# Patient Record
Sex: Female | Born: 1950 | Race: Black or African American | Hispanic: No | Marital: Single | Smoking: Former smoker
Health system: Southern US, Community
[De-identification: ages and names within clinical notes are randomized; demographics above are authoritative.]

## PROBLEM LIST (undated history)

## (undated) DIAGNOSIS — I10 Essential (primary) hypertension: Secondary | ICD-10-CM

## (undated) DIAGNOSIS — Z923 Personal history of irradiation: Secondary | ICD-10-CM

## (undated) DIAGNOSIS — B379 Candidiasis, unspecified: Secondary | ICD-10-CM

## (undated) DIAGNOSIS — C50211 Malignant neoplasm of upper-inner quadrant of right female breast: Secondary | ICD-10-CM

## (undated) DIAGNOSIS — K219 Gastro-esophageal reflux disease without esophagitis: Secondary | ICD-10-CM

## (undated) DIAGNOSIS — Z87410 Personal history of cervical dysplasia: Secondary | ICD-10-CM

## (undated) DIAGNOSIS — K449 Diaphragmatic hernia without obstruction or gangrene: Secondary | ICD-10-CM

## (undated) DIAGNOSIS — I639 Cerebral infarction, unspecified: Secondary | ICD-10-CM

## (undated) DIAGNOSIS — H547 Unspecified visual loss: Secondary | ICD-10-CM

## (undated) HISTORY — PX: TONSILLECTOMY: SUR1361

## (undated) HISTORY — PX: WISDOM TOOTH EXTRACTION: SHX21

## (undated) HISTORY — DX: Candidiasis, unspecified: B37.9

---

## 1998-05-06 DIAGNOSIS — I639 Cerebral infarction, unspecified: Secondary | ICD-10-CM

## 1998-05-06 HISTORY — DX: Cerebral infarction, unspecified: I63.9

## 1998-11-06 ENCOUNTER — Encounter: Payer: Self-pay | Admitting: Emergency Medicine

## 1998-11-07 ENCOUNTER — Inpatient Hospital Stay (HOSPITAL_COMMUNITY): Admission: EM | Admit: 1998-11-07 | Discharge: 1998-11-11 | Payer: Self-pay | Admitting: Emergency Medicine

## 1998-11-07 ENCOUNTER — Encounter: Payer: Self-pay | Admitting: Internal Medicine

## 1998-11-08 ENCOUNTER — Encounter: Payer: Self-pay | Admitting: Internal Medicine

## 1998-11-09 ENCOUNTER — Encounter: Payer: Self-pay | Admitting: Internal Medicine

## 1998-11-20 ENCOUNTER — Inpatient Hospital Stay (HOSPITAL_COMMUNITY): Admission: RE | Admit: 1998-11-20 | Discharge: 1998-11-21 | Payer: Self-pay | Admitting: *Deleted

## 1998-11-29 ENCOUNTER — Encounter: Admission: RE | Admit: 1998-11-29 | Discharge: 1998-11-29 | Payer: Self-pay | Admitting: Internal Medicine

## 1999-05-07 HISTORY — PX: CAROTID ENDARTERECTOMY: SUR193

## 1999-09-04 ENCOUNTER — Encounter: Payer: Self-pay | Admitting: Surgery

## 1999-09-04 ENCOUNTER — Encounter: Admission: RE | Admit: 1999-09-04 | Discharge: 1999-09-04 | Payer: Self-pay | Admitting: Surgery

## 2000-02-19 ENCOUNTER — Encounter: Payer: Self-pay | Admitting: Surgery

## 2000-02-19 ENCOUNTER — Encounter: Admission: RE | Admit: 2000-02-19 | Discharge: 2000-02-19 | Payer: Self-pay | Admitting: Surgery

## 2000-03-05 ENCOUNTER — Encounter: Admission: RE | Admit: 2000-03-05 | Discharge: 2000-03-05 | Payer: Self-pay | Admitting: *Deleted

## 2000-03-05 ENCOUNTER — Encounter: Payer: Self-pay | Admitting: *Deleted

## 2000-03-14 ENCOUNTER — Encounter: Payer: Self-pay | Admitting: *Deleted

## 2000-03-14 ENCOUNTER — Encounter: Admission: RE | Admit: 2000-03-14 | Discharge: 2000-03-14 | Payer: Self-pay | Admitting: *Deleted

## 2001-04-30 ENCOUNTER — Encounter: Admission: RE | Admit: 2001-04-30 | Discharge: 2001-04-30 | Payer: Self-pay | Admitting: *Deleted

## 2001-04-30 ENCOUNTER — Encounter: Payer: Self-pay | Admitting: *Deleted

## 2001-05-04 ENCOUNTER — Other Ambulatory Visit: Admission: RE | Admit: 2001-05-04 | Discharge: 2001-05-04 | Payer: Self-pay | Admitting: Obstetrics and Gynecology

## 2001-09-30 ENCOUNTER — Encounter: Payer: Self-pay | Admitting: Obstetrics and Gynecology

## 2001-09-30 ENCOUNTER — Encounter: Admission: RE | Admit: 2001-09-30 | Discharge: 2001-09-30 | Payer: Self-pay | Admitting: Obstetrics and Gynecology

## 2002-11-23 ENCOUNTER — Encounter: Admission: RE | Admit: 2002-11-23 | Discharge: 2002-11-23 | Payer: Self-pay | Admitting: Obstetrics and Gynecology

## 2002-11-23 ENCOUNTER — Encounter: Payer: Self-pay | Admitting: Obstetrics and Gynecology

## 2003-04-05 ENCOUNTER — Encounter: Admission: RE | Admit: 2003-04-05 | Discharge: 2003-04-05 | Payer: Self-pay | Admitting: Surgery

## 2003-05-25 ENCOUNTER — Other Ambulatory Visit: Admission: RE | Admit: 2003-05-25 | Discharge: 2003-05-25 | Payer: Self-pay | Admitting: Family Medicine

## 2003-08-04 ENCOUNTER — Ambulatory Visit (HOSPITAL_BASED_OUTPATIENT_CLINIC_OR_DEPARTMENT_OTHER): Admission: RE | Admit: 2003-08-04 | Discharge: 2003-08-04 | Payer: Self-pay | Admitting: Family Medicine

## 2003-08-16 ENCOUNTER — Encounter (HOSPITAL_COMMUNITY): Admission: RE | Admit: 2003-08-16 | Discharge: 2003-11-14 | Payer: Self-pay | Admitting: Family Medicine

## 2005-02-25 ENCOUNTER — Encounter: Admission: RE | Admit: 2005-02-25 | Discharge: 2005-02-25 | Payer: Self-pay | Admitting: Family Medicine

## 2005-03-29 ENCOUNTER — Emergency Department (HOSPITAL_COMMUNITY): Admission: EM | Admit: 2005-03-29 | Discharge: 2005-03-29 | Payer: Self-pay | Admitting: Family Medicine

## 2005-09-10 ENCOUNTER — Emergency Department (HOSPITAL_COMMUNITY): Admission: EM | Admit: 2005-09-10 | Discharge: 2005-09-10 | Payer: Self-pay | Admitting: Family Medicine

## 2006-02-26 ENCOUNTER — Encounter: Admission: RE | Admit: 2006-02-26 | Discharge: 2006-02-26 | Payer: Self-pay | Admitting: Family Medicine

## 2006-05-02 ENCOUNTER — Other Ambulatory Visit: Admission: RE | Admit: 2006-05-02 | Discharge: 2006-05-02 | Payer: Self-pay | Admitting: Family Medicine

## 2006-10-14 ENCOUNTER — Encounter: Admission: RE | Admit: 2006-10-14 | Discharge: 2006-10-14 | Payer: Self-pay | Admitting: Family Medicine

## 2006-10-31 ENCOUNTER — Ambulatory Visit (HOSPITAL_COMMUNITY): Admission: RE | Admit: 2006-10-31 | Discharge: 2006-10-31 | Payer: Self-pay | Admitting: Gastroenterology

## 2006-11-12 ENCOUNTER — Encounter: Admission: RE | Admit: 2006-11-12 | Discharge: 2006-11-12 | Payer: Self-pay | Admitting: Gastroenterology

## 2007-03-12 ENCOUNTER — Encounter: Admission: RE | Admit: 2007-03-12 | Discharge: 2007-03-12 | Payer: Self-pay | Admitting: Family Medicine

## 2007-05-05 ENCOUNTER — Other Ambulatory Visit: Admission: RE | Admit: 2007-05-05 | Discharge: 2007-05-05 | Payer: Self-pay | Admitting: Family Medicine

## 2007-07-20 ENCOUNTER — Encounter: Admission: RE | Admit: 2007-07-20 | Discharge: 2007-07-20 | Payer: Self-pay | Admitting: Family Medicine

## 2008-03-14 ENCOUNTER — Encounter: Admission: RE | Admit: 2008-03-14 | Discharge: 2008-03-14 | Payer: Self-pay | Admitting: Family Medicine

## 2008-05-05 ENCOUNTER — Other Ambulatory Visit: Admission: RE | Admit: 2008-05-05 | Discharge: 2008-05-05 | Payer: Self-pay | Admitting: Family Medicine

## 2009-03-15 ENCOUNTER — Encounter: Admission: RE | Admit: 2009-03-15 | Discharge: 2009-03-15 | Payer: Self-pay | Admitting: Family Medicine

## 2009-05-03 ENCOUNTER — Other Ambulatory Visit: Admission: RE | Admit: 2009-05-03 | Discharge: 2009-05-03 | Payer: Self-pay | Admitting: Family Medicine

## 2010-03-15 ENCOUNTER — Encounter: Admission: RE | Admit: 2010-03-15 | Discharge: 2010-03-15 | Payer: Self-pay | Admitting: Obstetrics and Gynecology

## 2010-05-26 ENCOUNTER — Encounter: Payer: Self-pay | Admitting: Family Medicine

## 2010-09-18 NOTE — Op Note (Signed)
Caitlyn Mccoy, Caitlyn Mccoy               ACCOUNT NO.:  [id number]   MEDICAL RECORD NO.:  [id number]          PATIENT TYPE:  AMB   LOCATION:  ENDO                         FACILITY:  [facility]   PHYSICIAN:  Anselmo Rod, M.D.  DATE OF BIRTH:  [birth year]   DATE OF PROCEDURE:  10/31/2006  DATE OF DISCHARGE:                               OPERATIVE REPORT   PROCEDURE PERFORMED:  Screening colonoscopy.   ENDOSCOPIST:  Anselmo Rod, MD   INSTRUMENT USED:  Pentax video colonoscope.   INDICATIONS FOR PROCEDURE:  A 60 year old African American female  undergoing a screening colonoscopy to rule out colonic polyps, masses,  etc.   PREPROCEDURE PREPARATION:  Informed consent was procured from the  patient. The patient was fasted for 8 hours prior to the procedure and  prepped with a bottle of magnesium citrate, 2 Dulcolax pills and a  gallon of NuLytely the night prior to procedure.  Risks and benefits of  the procedure including a 10% miss rate of cancer in polyps were  discussed with the patient as well.   PREPROCEDURE PHYSICAL:  VITAL SIGNS:  The patient had stable vital  signs.  NECK:  Supple.  CHEST:  Clear to auscultation.  CARDIAC:  S1 and S2 regular.  ABDOMEN:  Soft with normal bowel sounds.   DESCRIPTION OF PROCEDURE:  The patient was placed in the left lateral  decubitus position and sedated with 75 mcg of Fentanyl and 7.5 mg of  Versed given intravenously in slow incremental doses. Once the patient  was adequately sedated and maintained on low-flow oxygen and continuous  cardiac monitoring, the Pentax video colonoscope was advanced from the  rectum to the cecum. The patient had an excellent prep. No masses,  polyps, erosions, ulcerations or diverticula were seen.  The appendiceal  orifice and ileocecal valve were clearly visualized and photographed.  The terminal ileum was briefly visualized and appeared normal.  Retroflexion in the rectum revealed no abnormalities. The patient  tolerated the procedure well without immediate complications.   IMPRESSION:  1. Normal colonoscopy up to the terminal ileum, no masses, polyps,      erosions, ulcerations or diverticula seen.  2. No abnormalities noted on retroflexion.   RECOMMENDATIONS:  1. Continue a high-fiber diet with probiotics line Align.  2. Outpatient followup in the next 2 weeks for further evaluation of      her dysphagia and upper GI symptoms.  3. A repeat colonoscopy in the next 10 years.  If the patient has any      abnormal symptoms in the interim, she should contact the office      immediately for further recommendations with regards to CRC      screening at an earlier date.      Anselmo Rod, M.D.  Electronically Signed     JNM/MEDQ  D:  10/31/2006  T:  10/31/2006  Job:  [id number]

## 2011-02-01 ENCOUNTER — Other Ambulatory Visit: Payer: Self-pay | Admitting: Obstetrics and Gynecology

## 2011-02-01 DIAGNOSIS — Z1231 Encounter for screening mammogram for malignant neoplasm of breast: Secondary | ICD-10-CM

## 2011-03-18 ENCOUNTER — Ambulatory Visit
Admission: RE | Admit: 2011-03-18 | Discharge: 2011-03-18 | Disposition: A | Discharge status: Home / Self Care | Payer: BC Managed Care – PPO | Source: Ambulatory Visit | Attending: Obstetrics and Gynecology | Admitting: Obstetrics and Gynecology

## 2011-03-18 ENCOUNTER — Ambulatory Visit: Payer: Self-pay

## 2011-03-18 DIAGNOSIS — Z1231 Encounter for screening mammogram for malignant neoplasm of breast: Secondary | ICD-10-CM

## 2012-02-07 ENCOUNTER — Other Ambulatory Visit: Payer: Self-pay | Admitting: Obstetrics and Gynecology

## 2012-02-07 DIAGNOSIS — Z1231 Encounter for screening mammogram for malignant neoplasm of breast: Secondary | ICD-10-CM

## 2012-02-26 ENCOUNTER — Encounter: Payer: BC Managed Care – PPO | Admitting: Family Medicine

## 2012-03-17 ENCOUNTER — Encounter: Payer: Self-pay | Admitting: Family Medicine

## 2012-03-17 ENCOUNTER — Other Ambulatory Visit: Payer: Self-pay

## 2012-03-17 ENCOUNTER — Ambulatory Visit (INDEPENDENT_AMBULATORY_CARE_PROVIDER_SITE_OTHER): Payer: BC Managed Care – PPO | Admitting: Family Medicine

## 2012-03-17 VITALS — BP 126/70 | HR 71 | Ht 58.5 in | Wt 174.0 lb

## 2012-03-17 DIAGNOSIS — K219 Gastro-esophageal reflux disease without esophagitis: Secondary | ICD-10-CM

## 2012-03-17 DIAGNOSIS — Z8673 Personal history of transient ischemic attack (TIA), and cerebral infarction without residual deficits: Secondary | ICD-10-CM

## 2012-03-17 DIAGNOSIS — Z9889 Other specified postprocedural states: Secondary | ICD-10-CM

## 2012-03-17 DIAGNOSIS — Z8639 Personal history of other endocrine, nutritional and metabolic disease: Secondary | ICD-10-CM

## 2012-03-17 DIAGNOSIS — Z0000 Encounter for general adult medical examination without abnormal findings: Secondary | ICD-10-CM

## 2012-03-17 DIAGNOSIS — E669 Obesity, unspecified: Secondary | ICD-10-CM

## 2012-03-17 DIAGNOSIS — Z23 Encounter for immunization: Secondary | ICD-10-CM

## 2012-03-17 DIAGNOSIS — R6889 Other general symptoms and signs: Secondary | ICD-10-CM

## 2012-03-17 LAB — CBC WITH DIFFERENTIAL/PLATELET
Basophils Relative: 0 % (ref 0–1)
Eosinophils Absolute: 0.1 10*3/uL (ref 0.0–0.7)
HCT: 42.3 % (ref 36.0–46.0)
Hemoglobin: 14.5 g/dL (ref 12.0–15.0)
MCH: 29.5 pg (ref 26.0–34.0)
MCHC: 34.3 g/dL (ref 30.0–36.0)
MCV: 86.2 fL (ref 78.0–100.0)
Monocytes Absolute: 0.4 10*3/uL (ref 0.1–1.0)
Monocytes Relative: 6 % (ref 3–12)
Neutro Abs: 3.8 10*3/uL (ref 1.7–7.7)

## 2012-03-17 MED ORDER — DEXLANSOPRAZOLE 60 MG PO CPDR: 60.0000 mg | DELAYED_RELEASE_CAPSULE | Freq: Every day | ORAL | Status: DC

## 2012-03-17 NOTE — Progress Notes (Signed)
Subjective:    Patient ID: Caitlyn Mccoy, female    DOB: [birth year], 61 y.o.   MRN: [id number]  HPI She is here for complete examination. She is on a weight loss program eating mainly fruits and vegetables avoiding meats and dairy and has lost 14 pounds by her recollection. Does complain of bad breath and has described this to the dentist who apparently found nothing. She complains of cold intolerant. She apparently also has had a history of goiter and was hypothyroid presently not on meds she has had reflux symptoms in the past and has tried Protonix, Nexium and Prilosec none of which have worked. He complains of bad breath and thinks it could be related to this She also states she had a CVA and still has some difficulty with swallowing this for. She has had a carotid endarterectomy Review of Systems Negative except as above    Objective:   Physical Exam BP 126/70  Pulse 71  Ht 4' 10.5" (1.486 m)  Wt 174 lb (78.926 kg)  BMI 35.75 kg/m2  SpO2 96%  General Appearance:    Alert, cooperative, no distress, appears stated age  Head:    Normocephalic, without obvious abnormality, atraumatic  Eyes:    PERRL, conjunctiva/corneas clear, EOM's intact, fundi    benign  Ears:    Normal TM's and external ear canals  Nose:   Nares normal, mucosa normal, no drainage or sinus   tenderness  Throat:   Lips, mucosa, and tongue normal; teeth and gums normal  Neck:   Supple, no lymphadenopathy;  thyroid:  no   enlargement/tenderness/nodules; no carotid   bruit or JVD  Back:    Spine nontender, no curvature, ROM normal, no CVA     tenderness  Lungs:     Clear to auscultation bilaterally without wheezes, rales or     ronchi; respirations unlabored  Chest Wall:    No tenderness or deformity   Heart:    Regular rate and rhythm, S1 and S2 normal, no murmur, rub   or gallop  Breast Exam:    Deferred to GYN  Abdomen:     Soft, non-tender, nondistended, normoactive bowel sounds,    no masses, no  hepatosplenomegaly  Genitalia:    Deferred to GYN     Extremities:   No clubbing, cyanosis or edema  Pulses:   2+ and symmetric all extremities  Skin:   Skin color, texture, turgor normal, no rashes or lesions  Lymph nodes:   Cervical, supraclavicular, and axillary nodes normal  Neurologic:   CNII-XII intact, normal strength, sensation and gait; reflexes 2+ and symmetric throughout          Psych:   Normal mood, affect, hygiene and grooming.          Assessment & Plan:   1. Cold intolerance  T3, Free, T4, Free  2. Routine general medical examination at a health care facility  Lipid panel, CBC with Differential, Comprehensive metabolic panel, Vitamin D 25 hydroxy, TSH, Tdap vaccine greater than or equal to 7yo IM  3. GERD (gastroesophageal reflux disease)  DISCONTINUED: dexlansoprazole (DEXILANT) 60 MG capsule  4. Personal history of goiter  TSH  5. Obesity (BMI 30-39.9)  Lipid panel  6. History of CVA (cerebrovascular accident)    7. History of carotid endarterectomy     she has had a colonoscopy. She refused both the flu shot and Zostavax Sample of Dexilant given to see if this will help with her reflux symptoms and  possibly her bad breath.

## 2012-03-17 NOTE — Telephone Encounter (Signed)
PT ASK IF SHE COULD HAVE 5 MORE GAVE  HER 5 MORE

## 2012-03-18 ENCOUNTER — Ambulatory Visit
Admission: RE | Admit: 2012-03-18 | Discharge: 2012-03-18 | Disposition: A | Discharge status: Home / Self Care | Payer: BC Managed Care – PPO | Source: Ambulatory Visit | Attending: Obstetrics and Gynecology | Admitting: Obstetrics and Gynecology

## 2012-03-18 ENCOUNTER — Other Ambulatory Visit: Payer: Self-pay | Admitting: Family Medicine

## 2012-03-18 DIAGNOSIS — Z1231 Encounter for screening mammogram for malignant neoplasm of breast: Secondary | ICD-10-CM

## 2012-03-18 LAB — T3, FREE: T3, Free: 3 pg/mL (ref 2.3–4.2)

## 2012-03-18 LAB — COMPREHENSIVE METABOLIC PANEL
Albumin: 4.1 g/dL (ref 3.5–5.2)
Alkaline Phosphatase: 116 U/L (ref 39–117)
BUN: 16 mg/dL (ref 6–23)
Glucose, Bld: 69 mg/dL — ABNORMAL LOW (ref 70–99)
Potassium: 4 mEq/L (ref 3.5–5.3)

## 2012-03-18 LAB — LIPID PANEL
Cholesterol: 214 mg/dL — ABNORMAL HIGH (ref 0–200)
Triglycerides: 67 mg/dL (ref <150)

## 2012-03-18 LAB — TSH: TSH: 0.461 u[IU]/mL (ref 0.350–4.500)

## 2012-03-19 ENCOUNTER — Telehealth: Payer: Self-pay | Admitting: Family Medicine

## 2012-03-19 NOTE — Telephone Encounter (Signed)
Left pt message on phone [phone] mailed out lab results yesterday and med should not interfear with hernia

## 2012-03-19 NOTE — Telephone Encounter (Signed)
PT CALLED AND STATED THAT SHE HAS A HIATAL HERNIA THAT SHE FAILED TO MENTIONED ON THE 12TH.  SHE JUST WANTS TO MAKE SURE THAT WILL NOT CAUSE ISSUES FOR THE NEW MEDICATION DEXILANT THAT YOU HAVE PLACED HER ON . PLEASE CALL PT.

## 2012-03-19 NOTE — Telephone Encounter (Signed)
Let her know that the hiatus hernia issue should not interfere with Dexilant and will most likely help if she is having any hernia symptoms

## 2012-03-26 ENCOUNTER — Other Ambulatory Visit: Payer: Self-pay

## 2012-03-26 DIAGNOSIS — K219 Gastro-esophageal reflux disease without esophagitis: Secondary | ICD-10-CM

## 2012-03-26 MED ORDER — DEXLANSOPRAZOLE 60 MG PO CPDR: 60.0000 mg | DELAYED_RELEASE_CAPSULE | Freq: Every day | ORAL | Status: DC

## 2012-03-31 ENCOUNTER — Other Ambulatory Visit: Payer: Self-pay | Admitting: Obstetrics and Gynecology

## 2012-04-01 ENCOUNTER — Telehealth: Payer: Self-pay | Admitting: Family Medicine

## 2012-04-06 NOTE — Telephone Encounter (Signed)
LM

## 2012-04-27 ENCOUNTER — Encounter: Payer: BC Managed Care – PPO | Admitting: Family Medicine

## 2012-05-12 ENCOUNTER — Encounter: Payer: Self-pay | Admitting: Obstetrics and Gynecology

## 2012-05-12 ENCOUNTER — Ambulatory Visit (INDEPENDENT_AMBULATORY_CARE_PROVIDER_SITE_OTHER): Payer: BC Managed Care – PPO | Admitting: Obstetrics and Gynecology

## 2012-05-12 VITALS — BP 136/86 | Ht 59.0 in | Wt 175.0 lb

## 2012-05-12 DIAGNOSIS — Z124 Encounter for screening for malignant neoplasm of cervix: Secondary | ICD-10-CM

## 2012-05-12 MED ORDER — VITAMIN D (ERGOCALCIFEROL) 1.25 MG (50000 UNIT) PO CAPS: 50000.0000 [IU] | ORAL_CAPSULE | ORAL | Status: DC

## 2012-05-12 NOTE — Patient Instructions (Signed)
Exercise to Stay Healthy Exercise helps you become and stay healthy. EXERCISE IDEAS AND TIPS Choose exercises that:  You enjoy.  Fit into your day. You do not need to exercise really hard to be healthy. You can do exercises at a slow or medium level and stay healthy. You can:  Stretch before and after working out.  Try yoga, Pilates, or tai chi.  Lift weights.  Walk fast, swim, jog, run, climb stairs, bicycle, dance, or rollerskate.  Take aerobic classes. Exercises that burn about 150 calories:  Running 1  miles in 15 minutes.  Playing volleyball for 45 to 60 minutes.  Washing and waxing a car for 45 to 60 minutes.  Playing touch football for 45 minutes.  Walking 1  miles in 35 minutes.  Pushing a stroller 1  miles in 30 minutes.  Playing basketball for 30 minutes.  Raking leaves for 30 minutes.  Bicycling 5 miles in 30 minutes.  Walking 2 miles in 30 minutes.  Dancing for 30 minutes.  Shoveling snow for 15 minutes.  Swimming laps for 20 minutes.  Walking up stairs for 15 minutes.  Bicycling 4 miles in 15 minutes.  Gardening for 30 to 45 minutes.  Jumping rope for 15 minutes.  Washing windows or floors for 45 to 60 minutes. Document Released: 05/25/2010 Document Revised: 07/15/2011 Document Reviewed: 05/25/2010 [employer] Patient Information 2013 [employer].  

## 2012-05-12 NOTE — Progress Notes (Signed)
Last Pap: 05/09/2011 WNL: Yes Regular Periods:no postmenopausal Contraception: none  Monthly Breast exam:yes Tetanus<25yrs:yes Nl.Bladder Function:yes Daily BMs:yes Healthy Diet:yes Calcium:no Mammogram:yes Date of Mammogram: 03/18/2012 Exercise:yes Have often Exercise: elliptical twice weekly  Seatbelt: yes Abuse at home: no Stressful work:yes sometimes  Sigmoid-colonoscopy: per pt 2007 Bone Density: yes 03/15/2009 PCP: Dr Sharlot Gowda  Change in PMH: none Change in ZOX:WRUE  BP 136/86  Ht 4\' 11"  (1.499 m)  Wt 175 lb (79.379 kg)  BMI 35.35 kg/m2 Pt without c/o Physical Examination: Neck - supple, no significant adenopathy Chest - clear to auscultation, no wheezes, rales or rhonchi, symmetric air entry Heart - normal rate, regular rhythm, normal S1, S2, no murmurs, rubs, clicks or gallops Abdomen - soft, nontender, nondistended, no masses or organomegaly Breasts - breasts appear normal, no suspicious masses, no skin or nipple changes or axillary nodes Pelvic - normal external genitalia, vulva, vagina, cervix, uterus and adnexa, atrophic Rectal - normal rectal, no masses Musculoskeletal - no joint tenderness, deformity or swelling Extremities - peripheral pulses normal, no pedal edema, no clubbing or cyanosis Skin - normal coloration and turgor, no rashes, no suspicious skin lesions noted Low vitamin D.  21 will replace for 12 weeks and then recheck the level Liver function and cholestrol levels improved Normal AEX Pt due for mammogram no colonoscopy due no Pap done yes if normal repeat in 3 yrs RT one year Diet and exercise discussed

## 2012-05-14 LAB — PAP IG W/ RFLX HPV ASCU

## 2012-07-07 ENCOUNTER — Telehealth: Payer: Self-pay | Admitting: Family Medicine

## 2012-07-07 NOTE — Telephone Encounter (Signed)
Pt advised we do not have any dexilant samples but we have a savings card pt states she will pick up in the morning

## 2012-08-11 ENCOUNTER — Other Ambulatory Visit: Payer: BC Managed Care – PPO

## 2013-02-05 ENCOUNTER — Other Ambulatory Visit: Payer: Self-pay

## 2013-02-05 DIAGNOSIS — Z1231 Encounter for screening mammogram for malignant neoplasm of breast: Secondary | ICD-10-CM

## 2013-03-19 ENCOUNTER — Ambulatory Visit
Admission: RE | Admit: 2013-03-19 | Discharge: 2013-03-19 | Disposition: A | Discharge status: Home / Self Care | Payer: BC Managed Care – PPO | Source: Ambulatory Visit

## 2013-03-19 DIAGNOSIS — Z1231 Encounter for screening mammogram for malignant neoplasm of breast: Secondary | ICD-10-CM

## 2014-02-08 ENCOUNTER — Other Ambulatory Visit: Payer: Self-pay

## 2014-02-08 DIAGNOSIS — Z1231 Encounter for screening mammogram for malignant neoplasm of breast: Secondary | ICD-10-CM

## 2014-03-07 ENCOUNTER — Encounter: Payer: Self-pay | Admitting: Obstetrics and Gynecology

## 2014-03-21 ENCOUNTER — Ambulatory Visit: Payer: BC Managed Care – PPO

## 2014-03-22 ENCOUNTER — Ambulatory Visit
Admission: RE | Admit: 2014-03-22 | Discharge: 2014-03-22 | Disposition: A | Discharge status: Home / Self Care | Payer: BC Managed Care – PPO | Source: Ambulatory Visit

## 2014-03-22 ENCOUNTER — Encounter (INDEPENDENT_AMBULATORY_CARE_PROVIDER_SITE_OTHER): Payer: Self-pay

## 2014-03-22 DIAGNOSIS — Z1231 Encounter for screening mammogram for malignant neoplasm of breast: Secondary | ICD-10-CM

## 2014-05-02 ENCOUNTER — Encounter: Payer: Self-pay | Admitting: Family Medicine

## 2015-02-10 ENCOUNTER — Other Ambulatory Visit: Payer: Self-pay | Admitting: Obstetrics and Gynecology

## 2015-02-10 DIAGNOSIS — Z1231 Encounter for screening mammogram for malignant neoplasm of breast: Secondary | ICD-10-CM

## 2015-03-24 ENCOUNTER — Ambulatory Visit
Admission: RE | Admit: 2015-03-24 | Discharge: 2015-03-24 | Disposition: A | Discharge status: Home / Self Care | Payer: BC Managed Care – PPO | Source: Ambulatory Visit | Attending: Obstetrics and Gynecology | Admitting: Obstetrics and Gynecology

## 2015-03-24 DIAGNOSIS — Z1231 Encounter for screening mammogram for malignant neoplasm of breast: Secondary | ICD-10-CM

## 2016-01-29 ENCOUNTER — Other Ambulatory Visit: Payer: Self-pay | Admitting: Obstetrics and Gynecology

## 2016-01-29 DIAGNOSIS — Z1231 Encounter for screening mammogram for malignant neoplasm of breast: Secondary | ICD-10-CM

## 2016-03-25 ENCOUNTER — Ambulatory Visit
Admission: RE | Admit: 2016-03-25 | Discharge: 2016-03-25 | Disposition: A | Discharge status: Home / Self Care | Payer: BC Managed Care – PPO | Source: Ambulatory Visit | Attending: Obstetrics and Gynecology | Admitting: Obstetrics and Gynecology

## 2016-03-25 DIAGNOSIS — Z1231 Encounter for screening mammogram for malignant neoplasm of breast: Secondary | ICD-10-CM

## 2017-01-02 ENCOUNTER — Other Ambulatory Visit: Payer: Self-pay | Admitting: Obstetrics and Gynecology

## 2017-01-02 DIAGNOSIS — Z1231 Encounter for screening mammogram for malignant neoplasm of breast: Secondary | ICD-10-CM

## 2017-03-26 ENCOUNTER — Ambulatory Visit
Admission: RE | Admit: 2017-03-26 | Discharge: 2017-03-26 | Disposition: A | Discharge status: Home / Self Care | Payer: BC Managed Care – PPO | Source: Ambulatory Visit | Attending: Obstetrics and Gynecology | Admitting: Obstetrics and Gynecology

## 2017-03-26 DIAGNOSIS — Z1231 Encounter for screening mammogram for malignant neoplasm of breast: Secondary | ICD-10-CM

## 2018-02-06 ENCOUNTER — Other Ambulatory Visit: Payer: Self-pay | Admitting: Obstetrics and Gynecology

## 2018-02-06 DIAGNOSIS — Z1231 Encounter for screening mammogram for malignant neoplasm of breast: Secondary | ICD-10-CM

## 2018-03-30 ENCOUNTER — Ambulatory Visit: Payer: Self-pay | Admitting: Nurse Practitioner

## 2018-03-30 ENCOUNTER — Ambulatory Visit
Admission: RE | Admit: 2018-03-30 | Discharge: 2018-03-30 | Disposition: A | Discharge status: Home / Self Care | Payer: BC Managed Care – PPO | Source: Ambulatory Visit | Attending: Obstetrics and Gynecology | Admitting: Obstetrics and Gynecology

## 2018-03-30 DIAGNOSIS — Z1231 Encounter for screening mammogram for malignant neoplasm of breast: Secondary | ICD-10-CM

## 2018-04-01 ENCOUNTER — Other Ambulatory Visit: Payer: Self-pay | Admitting: Otolaryngology

## 2018-04-01 ENCOUNTER — Other Ambulatory Visit: Payer: Self-pay | Admitting: Obstetrics and Gynecology

## 2018-04-01 DIAGNOSIS — R1314 Dysphagia, pharyngoesophageal phase: Secondary | ICD-10-CM

## 2018-04-01 DIAGNOSIS — R928 Other abnormal and inconclusive findings on diagnostic imaging of breast: Secondary | ICD-10-CM

## 2018-04-27 ENCOUNTER — Ambulatory Visit
Admission: RE | Admit: 2018-04-27 | Discharge: 2018-04-27 | Disposition: A | Discharge status: Home / Self Care | Payer: BC Managed Care – PPO | Source: Ambulatory Visit | Attending: Obstetrics and Gynecology | Admitting: Obstetrics and Gynecology

## 2018-04-27 ENCOUNTER — Other Ambulatory Visit: Payer: BC Managed Care – PPO

## 2018-04-27 ENCOUNTER — Ambulatory Visit
Admission: RE | Admit: 2018-04-27 | Discharge: 2018-04-27 | Disposition: A | Discharge status: Home / Self Care | Payer: BC Managed Care – PPO | Source: Ambulatory Visit | Attending: Otolaryngology | Admitting: Otolaryngology

## 2018-04-27 ENCOUNTER — Other Ambulatory Visit: Payer: Self-pay | Admitting: Obstetrics and Gynecology

## 2018-04-27 DIAGNOSIS — R928 Other abnormal and inconclusive findings on diagnostic imaging of breast: Secondary | ICD-10-CM

## 2018-04-27 DIAGNOSIS — R1314 Dysphagia, pharyngoesophageal phase: Secondary | ICD-10-CM

## 2018-04-27 DIAGNOSIS — N6310 Unspecified lump in the right breast, unspecified quadrant: Secondary | ICD-10-CM

## 2018-05-26 ENCOUNTER — Other Ambulatory Visit: Payer: Self-pay | Admitting: Obstetrics and Gynecology

## 2018-05-26 ENCOUNTER — Ambulatory Visit
Admission: RE | Admit: 2018-05-26 | Discharge: 2018-05-26 | Disposition: A | Discharge status: Home / Self Care | Payer: BC Managed Care – PPO | Source: Ambulatory Visit | Attending: Obstetrics and Gynecology | Admitting: Obstetrics and Gynecology

## 2018-05-26 DIAGNOSIS — N6310 Unspecified lump in the right breast, unspecified quadrant: Secondary | ICD-10-CM

## 2018-05-27 ENCOUNTER — Other Ambulatory Visit: Payer: Self-pay | Admitting: Obstetrics and Gynecology

## 2018-05-27 DIAGNOSIS — Z853 Personal history of malignant neoplasm of breast: Secondary | ICD-10-CM

## 2018-05-28 ENCOUNTER — Other Ambulatory Visit: Payer: Self-pay | Admitting: Obstetrics and Gynecology

## 2018-05-28 DIAGNOSIS — C50911 Malignant neoplasm of unspecified site of right female breast: Secondary | ICD-10-CM

## 2018-05-29 ENCOUNTER — Other Ambulatory Visit: Payer: Self-pay | Admitting: Family Medicine

## 2018-06-01 ENCOUNTER — Ambulatory Visit
Admission: RE | Admit: 2018-06-01 | Discharge: 2018-06-01 | Disposition: A | Discharge status: Home / Self Care | Payer: BC Managed Care – PPO | Source: Ambulatory Visit | Attending: Obstetrics and Gynecology | Admitting: Obstetrics and Gynecology

## 2018-06-01 ENCOUNTER — Other Ambulatory Visit: Payer: Self-pay | Admitting: General Surgery

## 2018-06-01 DIAGNOSIS — C50911 Malignant neoplasm of unspecified site of right female breast: Secondary | ICD-10-CM

## 2018-06-01 DIAGNOSIS — C50211 Malignant neoplasm of upper-inner quadrant of right female breast: Secondary | ICD-10-CM

## 2018-06-01 DIAGNOSIS — Z170 Estrogen receptor positive status [ER+]: Principal | ICD-10-CM

## 2018-06-02 ENCOUNTER — Other Ambulatory Visit: Payer: Self-pay | Admitting: General Surgery

## 2018-06-02 DIAGNOSIS — C50211 Malignant neoplasm of upper-inner quadrant of right female breast: Secondary | ICD-10-CM

## 2018-06-02 DIAGNOSIS — Z170 Estrogen receptor positive status [ER+]: Principal | ICD-10-CM

## 2018-06-04 ENCOUNTER — Encounter (HOSPITAL_BASED_OUTPATIENT_CLINIC_OR_DEPARTMENT_OTHER): Payer: Self-pay | Admitting: *Deleted

## 2018-06-04 ENCOUNTER — Other Ambulatory Visit: Payer: Self-pay

## 2018-06-06 DIAGNOSIS — C50919 Malignant neoplasm of unspecified site of unspecified female breast: Secondary | ICD-10-CM

## 2018-06-06 HISTORY — DX: Malignant neoplasm of unspecified site of unspecified female breast: C50.919

## 2018-06-06 HISTORY — PX: BREAST LUMPECTOMY: SHX2

## 2018-06-07 NOTE — H&P (Signed)
Caitlyn Mccoy Location: [facility] Patient #: [id number] DOB: 1950/07/08 Single / Language: Caitlyn Mccoy / Race: Black or African American Female        History of Present Illness       This is a very pleasant 68 year old female. She is referred by [name], her gynecologist and by [name] at the [facility] for evaluation and management of a invasive cancer in the right breast upper inner quadrant. Her daughter is present and Caitlyn Mccoy was my chaperone throughout the encounter. Caitlyn Mccoy is her PCP who she sees occasionally.     She has no prior history of breast problems. He gets annual mammograms. Mammograms recently showed a 7 mm mass in the right breast upper inner quadrant. No other mass in either breast. Ultrasound of the right axilla was negative. 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 ER and PR 100% strongly positive. Ki-67 12%. She has a hematoma     This history is significant for cerebrovascular accident in 2000. That was followed by a right carotid endarterectomy. She has no residual. She is not on any blood thinners now. BMI 35. Family history reveals mother had diabetes. Father had a heart attack. No family history of breast, ovarian, or prostate cancer. Social history reveals she is single. Quit smoking 30 years ago. Denies alcohol. She has 1 daughter with her today. One child is deceased. She works in an Astronomer at [employer].     We had a very long discussion. She is strongly motivated for breast conservation and I think she is an excellent candidate for that. We talked about lumpectomy, radiation therapy, sentinel node biopsy. We talked about mastectomy as an alternative. She will be immediately referred to medical oncology and radiation oncology She is on the breast conference schedule this coming Wednesday and we will discuss her case then it she is aware of that. She wanted  to go ahead and tentatively schedule her surgery and so were going to do that as well      She'll be scheduled for injection blue dye right breast, right breast lumpectomy with radioactive seed localization, right axillary deep sentinel lymph node biopsy. I discussed the indications, details, techniques, numerous risk of the surgery with her and her daughter. She is aware the risk of bleeding, infection, reoperation for positive margins or positive nodes, cosmetic deformity, nerve damage with chronic pain, arm swelling, arm numbness, shoulder disability. She understands all of these issues. All of her questions are answered. She agrees with this plan.   Past Surgical History Breast Biopsy  Right. Carotid Artery Surgery  Right. Foot Surgery  Right. Oral Surgery  Tonsillectomy   Diagnostic Studies History  Colonoscopy  1-5 years ago Mammogram  within last year  Allergies  Sulfa Antibiotics  Penicillins  Ampicillin *PENICILLINS*  Allergies Reconciled   Medication History  No Current Medications Medications Reconciled  Social History Alcohol use  Remotely quit alcohol use. Caffeine use  Carbonated beverages, Coffee, Tea. No drug use  Tobacco use  Former smoker.  Family History  Diabetes Mellitus  Mother.  Pregnancy / Birth History  Age at menarche  29 years. Gravida  4 Maternal age  61-25 Para  2  Other Problems  Breast Cancer  Cerebrovascular Accident  Gastroesophageal Reflux Disease     Review of Systems  General Not Present- Appetite Loss, Chills, Fatigue, Fever, Night Sweats, Weight Gain and Weight Loss. Skin Not Present- Change in  Wart/Mole, Dryness, Hives, Jaundice, New Lesions, Non-Healing Wounds, Rash and Ulcer. HEENT Present- Sinus Pain and Wears glasses/contact lenses. Not Present- Earache, Hearing Loss, Hoarseness, Nose Bleed, Oral Ulcers, Ringing in the Ears, Seasonal Allergies, Sore Throat, Visual Disturbances and Yellow  Eyes. Respiratory Not Present- Bloody sputum, Chronic Cough, Difficulty Breathing, Snoring and Wheezing. Breast Present- Breast Mass. Not Present- Breast Pain, Nipple Discharge and Skin Changes. Cardiovascular Not Present- Chest Pain, Difficulty Breathing Lying Down, Leg Cramps, Palpitations, Rapid Heart Rate, Shortness of Breath and Swelling of Extremities. Gastrointestinal Not Present- Abdominal Pain, Bloating, Bloody Stool, Change in Bowel Habits, Chronic diarrhea, Constipation, Difficulty Swallowing, Excessive gas, Gets full quickly at meals, Hemorrhoids, Indigestion, Nausea, Rectal Pain and Vomiting. Female Genitourinary Not Present- Frequency, Nocturia, Painful Urination, Pelvic Pain and Urgency. Musculoskeletal Present- Joint Pain and Joint Stiffness. Not Present- Back Pain, Muscle Pain, Muscle Weakness and Swelling of Extremities. Neurological Not Present- Decreased Memory, Fainting, Headaches, Numbness, Seizures, Tingling, Tremor, Trouble walking and Weakness. Psychiatric Not Present- Anxiety, Bipolar, Change in Sleep Pattern, Depression, Fearful and Frequent crying. Endocrine Not Present- Cold Intolerance, Excessive Hunger, Hair Changes, Heat Intolerance, Hot flashes and New Diabetes. Hematology Not Present- Blood Thinners, Easy Bruising, Excessive bleeding, Gland problems, HIV and Persistent Infections.  Vitals Weight: 177.6 lb Height: 59.5in Body Surface Area: 1.76 m Body Mass Index: 35.27 kg/m  Temp.: 97.35F  Pulse: 85 (Regular)  BP: 134/88 (Sitting, Left Arm, Standard)       Physical Exam  General Mental Status-Alert. General Appearance-Consistent with stated age. Hydration-Well hydrated. Voice-Normal.  Head and Neck Head-normocephalic, atraumatic with no lesions or palpable masses. Trachea-midline. Thyroid Gland Characteristics - normal size and consistency. Note: Well-healed right carotid endarterectomy scar   Eye Eyeball -  Bilateral-Extraocular movements intact. Sclera/Conjunctiva - Bilateral-No scleral icterus.  Chest and Lung Exam Chest and lung exam reveals -quiet, even and easy respiratory effort with no use of accessory muscles and on auscultation, normal breath sounds, no adventitious sounds and normal vocal resonance. Inspection Chest Wall - Normal. Back - normal.  Breast Note: Breasts are moderately large. Upper inner quadrant of the right breast reveals a 4 cm hematoma. No other masses or skin changes either breast. No palpable adenopathy.   Cardiovascular Cardiovascular examination reveals -normal heart sounds, regular rate and rhythm with no murmurs and normal pedal pulses bilaterally.  Abdomen Inspection Inspection of the abdomen reveals - No Hernias. Skin - Scar - no surgical scars. Palpation/Percussion Palpation and Percussion of the abdomen reveal - Soft, Non Tender, No Rebound tenderness, No Rigidity (guarding) and No hepatosplenomegaly. Auscultation Auscultation of the abdomen reveals - Bowel sounds normal.  Neurologic Neurologic evaluation reveals -alert and oriented x 3 with no impairment of recent or remote memory. Mental Status-Normal.  Musculoskeletal Normal Exam - Left-Upper Extremity Strength Normal and Lower Extremity Strength Normal. Normal Exam - Right-Upper Extremity Strength Normal and Lower Extremity Strength Normal.  Lymphatic Head & Neck  General Head & Neck Lymphatics: Bilateral - Description - Normal. Axillary  General Axillary Region: Bilateral - Description - Normal. Tenderness - Non Tender. Femoral & Inguinal  Generalized Femoral & Inguinal Lymphatics: Bilateral - Description - Normal. Tenderness - Non Tender.    Assessment & Plan  PRIMARY CANCER OF UPPER INNER QUADRANT OF RIGHT FEMALE BREAST (C50.211)   Your recent mammograms and biopsies found a 7 mm invasive ductal carcinoma of the right breast, upper inner quadrant This appears  to be a solitary finding The lymph nodes under the arm looked normal on ultrasound The tumor  is strongly positive for estrogen and progesterone receptor. It is negative for HER-2/neu.  You have expressed yourr strong desire for breast conservation surgery, and I think you are an excellent candidate for that You do not need a mastectomy. We will discuss your case at our breast cancer conference on 06/03/2018 you will be referred to medical oncology and radiation oncology immediately for their opinion We will begin scheduling you for right breast lumpectomy with radioactive seed localization and right axillary sentinel lymph node biopsy I have discussed the indications, techniques, and risk of the surgery with you and your daughter in detail.  HISTORY OF CVA IN ADULTHOOD (Z86.73) HISTORY OF RIGHT-SIDED CAROTID ENDARTERECTOMY (Z98.890) BMI 35.0-35.9,ADULT (Z68.35)    Edsel Petrin. Dalbert Batman, M.D., [facility] General and Minimally invasive Surgery Breast and Colorectal Surgery Office:   [phone] Pager:   [phone]

## 2018-06-10 ENCOUNTER — Encounter (HOSPITAL_BASED_OUTPATIENT_CLINIC_OR_DEPARTMENT_OTHER)
Admission: RE | Admit: 2018-06-10 | Discharge: 2018-06-10 | Disposition: A | Discharge status: Home / Self Care | Payer: BC Managed Care – PPO | Source: Ambulatory Visit | Attending: General Surgery | Admitting: General Surgery

## 2018-06-10 DIAGNOSIS — C50211 Malignant neoplasm of upper-inner quadrant of right female breast: Secondary | ICD-10-CM | POA: Diagnosis not present

## 2018-06-10 DIAGNOSIS — Z87891 Personal history of nicotine dependence: Secondary | ICD-10-CM | POA: Diagnosis not present

## 2018-06-10 DIAGNOSIS — L7632 Postprocedural hematoma of skin and subcutaneous tissue following other procedure: Secondary | ICD-10-CM | POA: Diagnosis not present

## 2018-06-10 DIAGNOSIS — Z01812 Encounter for preprocedural laboratory examination: Secondary | ICD-10-CM | POA: Insufficient documentation

## 2018-06-10 DIAGNOSIS — Z8673 Personal history of transient ischemic attack (TIA), and cerebral infarction without residual deficits: Secondary | ICD-10-CM | POA: Diagnosis not present

## 2018-06-10 DIAGNOSIS — Y848 Other medical procedures as the cause of abnormal reaction of the patient, or of later complication, without mention of misadventure at the time of the procedure: Secondary | ICD-10-CM | POA: Diagnosis not present

## 2018-06-10 DIAGNOSIS — Z170 Estrogen receptor positive status [ER+]: Secondary | ICD-10-CM | POA: Diagnosis not present

## 2018-06-10 DIAGNOSIS — Z880 Allergy status to penicillin: Secondary | ICD-10-CM | POA: Diagnosis not present

## 2018-06-10 DIAGNOSIS — Z882 Allergy status to sulfonamides status: Secondary | ICD-10-CM | POA: Diagnosis not present

## 2018-06-10 LAB — CBC WITH DIFFERENTIAL/PLATELET
Abs Immature Granulocytes: 0.01 10*3/uL (ref 0.00–0.07)
BASOS ABS: 0 10*3/uL (ref 0.0–0.1)
BASOS PCT: 0 %
Eosinophils Absolute: 0.1 10*3/uL (ref 0.0–0.5)
Eosinophils Relative: 2 %
HCT: 41 % (ref 36.0–46.0)
Hemoglobin: 13.5 g/dL (ref 12.0–15.0)
Immature Granulocytes: 0 %
Lymphocytes Relative: 25 %
Lymphs Abs: 1.3 10*3/uL (ref 0.7–4.0)
MCH: 30.3 pg (ref 26.0–34.0)
MCHC: 32.9 g/dL (ref 30.0–36.0)
MCV: 91.9 fL (ref 80.0–100.0)
Monocytes Absolute: 0.5 10*3/uL (ref 0.1–1.0)
Monocytes Relative: 9 %
NRBC: 0 % (ref 0.0–0.2)
Neutro Abs: 3.2 10*3/uL (ref 1.7–7.7)
Neutrophils Relative %: 64 %
Platelets: 180 10*3/uL (ref 150–400)
RBC: 4.46 MIL/uL (ref 3.87–5.11)
RDW: 12.8 % (ref 11.5–15.5)
WBC: 5.1 10*3/uL (ref 4.0–10.5)

## 2018-06-10 LAB — COMPREHENSIVE METABOLIC PANEL
ALBUMIN: 3.4 g/dL — AB (ref 3.5–5.0)
ALT: 98 U/L — ABNORMAL HIGH (ref 0–44)
AST: 126 U/L — ABNORMAL HIGH (ref 15–41)
Alkaline Phosphatase: 150 U/L — ABNORMAL HIGH (ref 38–126)
Anion gap: 11 (ref 5–15)
BUN: 6 mg/dL — ABNORMAL LOW (ref 8–23)
CO2: 22 mmol/L (ref 22–32)
Calcium: 9.3 mg/dL (ref 8.9–10.3)
Chloride: 112 mmol/L — ABNORMAL HIGH (ref 98–111)
Creatinine, Ser: 0.94 mg/dL (ref 0.44–1.00)
GFR calc Af Amer: >60 mL/min (ref >60)
GFR calc non Af Amer: >60 mL/min (ref >60)
Glucose, Bld: 89 mg/dL (ref 70–99)
POTASSIUM: 4.4 mmol/L (ref 3.5–5.1)
SODIUM: 145 mmol/L (ref 135–145)
Total Bilirubin: 0.6 mg/dL (ref 0.3–1.2)
Total Protein: 6.7 g/dL (ref 6.5–8.1)

## 2018-06-10 NOTE — Progress Notes (Addendum)
Ensure pre surgery drink given with instructions to complete by [facility], pt verbalized understanding.  Lab results reviewed by Dr. Lanetta Inch, will proceed with surgery as scheduled, spoke with Abigail Butts at Dr. Darrel Hoover office about AST-126, ALT-98.

## 2018-06-11 ENCOUNTER — Ambulatory Visit
Admission: RE | Admit: 2018-06-11 | Discharge: 2018-06-11 | Disposition: A | Discharge status: Home / Self Care | Payer: BC Managed Care – PPO | Source: Ambulatory Visit | Attending: General Surgery | Admitting: General Surgery

## 2018-06-11 DIAGNOSIS — Z170 Estrogen receptor positive status [ER+]: Principal | ICD-10-CM

## 2018-06-11 DIAGNOSIS — C50211 Malignant neoplasm of upper-inner quadrant of right female breast: Secondary | ICD-10-CM

## 2018-06-12 ENCOUNTER — Encounter (HOSPITAL_COMMUNITY)
Admission: RE | Admit: 2018-06-12 | Discharge: 2018-06-12 | Disposition: A | Discharge status: Home / Self Care | Payer: BC Managed Care – PPO | Source: Ambulatory Visit | Attending: General Surgery | Admitting: General Surgery

## 2018-06-12 ENCOUNTER — Other Ambulatory Visit: Payer: Self-pay

## 2018-06-12 ENCOUNTER — Encounter (HOSPITAL_BASED_OUTPATIENT_CLINIC_OR_DEPARTMENT_OTHER)
Admission: RE | Disposition: A | Discharge status: Home / Self Care | Payer: Self-pay | Source: Home / Self Care | Attending: General Surgery

## 2018-06-12 ENCOUNTER — Ambulatory Visit (HOSPITAL_BASED_OUTPATIENT_CLINIC_OR_DEPARTMENT_OTHER): Payer: BC Managed Care – PPO | Admitting: Anesthesiology

## 2018-06-12 ENCOUNTER — Ambulatory Visit (HOSPITAL_BASED_OUTPATIENT_CLINIC_OR_DEPARTMENT_OTHER)
Admission: RE | Admit: 2018-06-12 | Discharge: 2018-06-12 | Disposition: A | Discharge status: Home / Self Care | Payer: BC Managed Care – PPO | Attending: General Surgery | Admitting: General Surgery

## 2018-06-12 ENCOUNTER — Encounter (HOSPITAL_BASED_OUTPATIENT_CLINIC_OR_DEPARTMENT_OTHER): Payer: Self-pay

## 2018-06-12 ENCOUNTER — Ambulatory Visit
Admission: RE | Admit: 2018-06-12 | Discharge: 2018-06-12 | Disposition: A | Discharge status: Home / Self Care | Payer: BC Managed Care – PPO | Source: Ambulatory Visit | Attending: General Surgery | Admitting: General Surgery

## 2018-06-12 DIAGNOSIS — Z8673 Personal history of transient ischemic attack (TIA), and cerebral infarction without residual deficits: Secondary | ICD-10-CM | POA: Insufficient documentation

## 2018-06-12 DIAGNOSIS — C50211 Malignant neoplasm of upper-inner quadrant of right female breast: Secondary | ICD-10-CM | POA: Diagnosis present

## 2018-06-12 DIAGNOSIS — Z170 Estrogen receptor positive status [ER+]: Principal | ICD-10-CM

## 2018-06-12 DIAGNOSIS — L7632 Postprocedural hematoma of skin and subcutaneous tissue following other procedure: Secondary | ICD-10-CM | POA: Insufficient documentation

## 2018-06-12 DIAGNOSIS — Z880 Allergy status to penicillin: Secondary | ICD-10-CM | POA: Insufficient documentation

## 2018-06-12 DIAGNOSIS — Z882 Allergy status to sulfonamides status: Secondary | ICD-10-CM | POA: Insufficient documentation

## 2018-06-12 DIAGNOSIS — Z87891 Personal history of nicotine dependence: Secondary | ICD-10-CM | POA: Insufficient documentation

## 2018-06-12 DIAGNOSIS — Y848 Other medical procedures as the cause of abnormal reaction of the patient, or of later complication, without mention of misadventure at the time of the procedure: Secondary | ICD-10-CM | POA: Insufficient documentation

## 2018-06-12 HISTORY — DX: Malignant neoplasm of upper-inner quadrant of right female breast: C50.211

## 2018-06-12 HISTORY — PX: BREAST LUMPECTOMY WITH RADIOACTIVE SEED AND SENTINEL LYMPH NODE BIOPSY: SHX6550

## 2018-06-12 SURGERY — BREAST LUMPECTOMY WITH RADIOACTIVE SEED AND SENTINEL LYMPH NODE BIOPSY
Anesthesia: General | Site: Breast | Laterality: Right

## 2018-06-12 MED ORDER — ONDANSETRON HCL 4 MG/2ML IJ SOLN: 4.0000 mg | Freq: Once | INTRAMUSCULAR | Status: DC | PRN

## 2018-06-12 MED ORDER — MIDAZOLAM HCL 2 MG/2ML IJ SOLN
1.0000 mg | INTRAMUSCULAR | Status: DC | PRN
  Administered 2018-06-12: 2 mg via INTRAVENOUS

## 2018-06-12 MED ORDER — PROPOFOL 500 MG/50ML IV EMUL
INTRAVENOUS | Status: AC
  Filled 2018-06-12: qty 50

## 2018-06-12 MED ORDER — EPHEDRINE SULFATE 50 MG/ML IJ SOLN
INTRAMUSCULAR | Status: DC | PRN
  Administered 2018-06-12 (×2): 10 mg via INTRAVENOUS

## 2018-06-12 MED ORDER — ONDANSETRON HCL 4 MG/2ML IJ SOLN
INTRAMUSCULAR | Status: DC | PRN
  Administered 2018-06-12: 4 mg via INTRAVENOUS

## 2018-06-12 MED ORDER — GABAPENTIN 300 MG PO CAPS
ORAL_CAPSULE | ORAL | Status: AC
  Filled 2018-06-12: qty 1

## 2018-06-12 MED ORDER — HYDROCODONE-ACETAMINOPHEN 5-325 MG PO TABS: 1.0000 | ORAL_TABLET | Freq: Four times a day (QID) | ORAL | 0 refills | Status: DC | PRN

## 2018-06-12 MED ORDER — SUCCINYLCHOLINE CHLORIDE 20 MG/ML IJ SOLN
INTRAMUSCULAR | Status: DC | PRN
  Administered 2018-06-12: 140 mg via INTRAVENOUS

## 2018-06-12 MED ORDER — ACETAMINOPHEN 325 MG PO TABS: 650.0000 mg | ORAL_TABLET | ORAL | Status: DC | PRN

## 2018-06-12 MED ORDER — ACETAMINOPHEN 500 MG PO TABS
1000.0000 mg | ORAL_TABLET | ORAL | Status: AC
  Administered 2018-06-12: 1000 mg via ORAL

## 2018-06-12 MED ORDER — SODIUM CHLORIDE 0.9% FLUSH: 3.0000 mL | INTRAVENOUS | Status: DC | PRN

## 2018-06-12 MED ORDER — BUPIVACAINE-EPINEPHRINE (PF) 0.5% -1:200000 IJ SOLN
INTRAMUSCULAR | Status: DC | PRN
  Administered 2018-06-12: 30 mL

## 2018-06-12 MED ORDER — LACTATED RINGERS IV SOLN
INTRAVENOUS | Status: DC
  Administered 2018-06-12: 09:00:00 via INTRAVENOUS

## 2018-06-12 MED ORDER — LACTATED RINGERS IV SOLN: INTRAVENOUS | Status: DC

## 2018-06-12 MED ORDER — BUPIVACAINE-EPINEPHRINE 0.5% -1:200000 IJ SOLN
INTRAMUSCULAR | Status: DC | PRN
  Administered 2018-06-12: 8 mL
  Administered 2018-06-12: 5 mL

## 2018-06-12 MED ORDER — SODIUM CHLORIDE (PF) 0.9 % IJ SOLN
INTRAVENOUS | Status: DC | PRN
  Administered 2018-06-12: 5 mL via INTRADERMAL

## 2018-06-12 MED ORDER — PROPOFOL 10 MG/ML IV BOLUS
INTRAVENOUS | Status: DC | PRN
  Administered 2018-06-12: 200 mg via INTRAVENOUS
  Administered 2018-06-12: 150 mg via INTRAVENOUS

## 2018-06-12 MED ORDER — CEFAZOLIN SODIUM-DEXTROSE 2-4 GM/100ML-% IV SOLN: 2.0000 g | INTRAVENOUS | Status: DC

## 2018-06-12 MED ORDER — ACETAMINOPHEN 500 MG PO TABS: 1000.0000 mg | ORAL_TABLET | Freq: Four times a day (QID) | ORAL | Status: DC

## 2018-06-12 MED ORDER — OXYCODONE HCL 5 MG PO TABS: 5.0000 mg | ORAL_TABLET | ORAL | Status: DC | PRN

## 2018-06-12 MED ORDER — LIDOCAINE 2% (20 MG/ML) 5 ML SYRINGE
INTRAMUSCULAR | Status: AC
  Filled 2018-06-12: qty 5

## 2018-06-12 MED ORDER — ACETAMINOPHEN 500 MG PO TABS
ORAL_TABLET | ORAL | Status: AC
  Filled 2018-06-12: qty 2

## 2018-06-12 MED ORDER — MIDAZOLAM HCL 2 MG/2ML IJ SOLN
INTRAMUSCULAR | Status: AC
  Filled 2018-06-12: qty 2

## 2018-06-12 MED ORDER — DEXAMETHASONE SODIUM PHOSPHATE 10 MG/ML IJ SOLN
INTRAMUSCULAR | Status: AC
  Filled 2018-06-12: qty 1

## 2018-06-12 MED ORDER — CEFAZOLIN SODIUM-DEXTROSE 2-4 GM/100ML-% IV SOLN
INTRAVENOUS | Status: AC
  Filled 2018-06-12: qty 100

## 2018-06-12 MED ORDER — EPHEDRINE 5 MG/ML INJ
INTRAVENOUS | Status: AC
  Filled 2018-06-12: qty 10

## 2018-06-12 MED ORDER — CHLORHEXIDINE GLUCONATE CLOTH 2 % EX PADS: 6.0000 | MEDICATED_PAD | Freq: Once | CUTANEOUS | Status: DC

## 2018-06-12 MED ORDER — CELECOXIB 200 MG PO CAPS
200.0000 mg | ORAL_CAPSULE | ORAL | Status: AC
  Administered 2018-06-12: 200 mg via ORAL

## 2018-06-12 MED ORDER — LIDOCAINE 2% (20 MG/ML) 5 ML SYRINGE
INTRAMUSCULAR | Status: DC | PRN
  Administered 2018-06-12: 40 mg via INTRAVENOUS

## 2018-06-12 MED ORDER — FENTANYL CITRATE (PF) 100 MCG/2ML IJ SOLN
INTRAMUSCULAR | Status: AC
  Filled 2018-06-12: qty 2

## 2018-06-12 MED ORDER — FENTANYL CITRATE (PF) 100 MCG/2ML IJ SOLN
50.0000 ug | INTRAMUSCULAR | Status: DC | PRN
  Administered 2018-06-12: 100 ug via INTRAVENOUS
  Administered 2018-06-12: 50 ug via INTRAVENOUS

## 2018-06-12 MED ORDER — FENTANYL CITRATE (PF) 100 MCG/2ML IJ SOLN: 25.0000 ug | INTRAMUSCULAR | Status: DC | PRN

## 2018-06-12 MED ORDER — GABAPENTIN 300 MG PO CAPS
300.0000 mg | ORAL_CAPSULE | ORAL | Status: AC
  Administered 2018-06-12: 300 mg via ORAL

## 2018-06-12 MED ORDER — ACETAMINOPHEN 650 MG RE SUPP: 650.0000 mg | RECTAL | Status: DC | PRN

## 2018-06-12 MED ORDER — ONDANSETRON HCL 4 MG/2ML IJ SOLN
INTRAMUSCULAR | Status: AC
  Filled 2018-06-12: qty 2

## 2018-06-12 MED ORDER — SODIUM CHLORIDE 0.9 % IV SOLN: 250.0000 mL | INTRAVENOUS | Status: DC | PRN

## 2018-06-12 MED ORDER — SUCCINYLCHOLINE CHLORIDE 200 MG/10ML IV SOSY
PREFILLED_SYRINGE | INTRAVENOUS | Status: AC
  Filled 2018-06-12: qty 10

## 2018-06-12 MED ORDER — SCOPOLAMINE 1 MG/3DAYS TD PT72: 1.0000 | MEDICATED_PATCH | Freq: Once | TRANSDERMAL | Status: DC | PRN

## 2018-06-12 MED ORDER — TECHNETIUM TC 99M SULFUR COLLOID FILTERED
1.0000 | Freq: Once | INTRAVENOUS | Status: AC | PRN
  Administered 2018-06-12: 1 via INTRADERMAL

## 2018-06-12 MED ORDER — MEPERIDINE HCL 25 MG/ML IJ SOLN: 6.2500 mg | INTRAMUSCULAR | Status: DC | PRN

## 2018-06-12 MED ORDER — SODIUM CHLORIDE 0.9% FLUSH: 3.0000 mL | Freq: Two times a day (BID) | INTRAVENOUS | Status: DC

## 2018-06-12 MED ORDER — CELECOXIB 200 MG PO CAPS
ORAL_CAPSULE | ORAL | Status: AC
  Filled 2018-06-12: qty 1

## 2018-06-12 MED ORDER — HYDROMORPHONE HCL 1 MG/ML IJ SOLN: 0.2500 mg | INTRAMUSCULAR | Status: DC | PRN

## 2018-06-12 MED ORDER — DEXAMETHASONE SODIUM PHOSPHATE 4 MG/ML IJ SOLN
INTRAMUSCULAR | Status: DC | PRN
  Administered 2018-06-12: 10 mg via INTRAVENOUS

## 2018-06-12 SURGICAL SUPPLY — 61 items
ADH SKN CLS APL DERMABOND .7 (GAUZE/BANDAGES/DRESSINGS)
APPLIER CLIP 11 MED OPEN (CLIP) ×3
APR CLP MED 11 20 MLT OPN (CLIP) ×1
BANDAGE ACE 6X5 VEL STRL LF (GAUZE/BANDAGES/DRESSINGS) IMPLANT
BINDER BREAST LRG (GAUZE/BANDAGES/DRESSINGS) IMPLANT
BINDER BREAST MEDIUM (GAUZE/BANDAGES/DRESSINGS) IMPLANT
BINDER BREAST XLRG (GAUZE/BANDAGES/DRESSINGS) ×2 IMPLANT
BINDER BREAST XXLRG (GAUZE/BANDAGES/DRESSINGS) IMPLANT
BLADE HEX COATED 2.75 (ELECTRODE) ×3 IMPLANT
BLADE SURG 15 STRL LF DISP TIS (BLADE) ×2 IMPLANT
BLADE SURG 15 STRL SS (BLADE) ×6
CANISTER SUCT 1200ML W/VALVE (MISCELLANEOUS) ×3 IMPLANT
CHLORAPREP W/TINT 26ML (MISCELLANEOUS) ×3 IMPLANT
CLIP APPLIE 11 MED OPEN (CLIP) ×1 IMPLANT
COVER BACK TABLE 60X90IN (DRAPES) ×3 IMPLANT
COVER MAYO STAND STRL (DRAPES) ×3 IMPLANT
COVER PROBE W GEL 5X96 (DRAPES) ×3 IMPLANT
COVER WAND RF STERILE (DRAPES) IMPLANT
DECANTER SPIKE VIAL GLASS SM (MISCELLANEOUS) IMPLANT
DERMABOND ADVANCED (GAUZE/BANDAGES/DRESSINGS)
DERMABOND ADVANCED .7 DNX12 (GAUZE/BANDAGES/DRESSINGS) IMPLANT
DRAPE LAPAROSCOPIC ABDOMINAL (DRAPES) ×3 IMPLANT
DRAPE UTILITY XL STRL (DRAPES) ×3 IMPLANT
DRSG PAD ABDOMINAL 8X10 ST (GAUZE/BANDAGES/DRESSINGS) ×3 IMPLANT
ELECT REM PT RETURN 9FT ADLT (ELECTROSURGICAL) ×3
ELECTRODE REM PT RTRN 9FT ADLT (ELECTROSURGICAL) ×1 IMPLANT
GAUZE SPONGE 4X4 12PLY STRL (GAUZE/BANDAGES/DRESSINGS) ×3 IMPLANT
GAUZE SPONGE 4X4 12PLY STRL LF (GAUZE/BANDAGES/DRESSINGS) IMPLANT
GLOVE EUDERMIC 7 POWDERFREE (GLOVE) ×3 IMPLANT
GOWN STRL REUS W/ TWL LRG LVL3 (GOWN DISPOSABLE) ×1 IMPLANT
GOWN STRL REUS W/ TWL XL LVL3 (GOWN DISPOSABLE) ×1 IMPLANT
GOWN STRL REUS W/TWL LRG LVL3 (GOWN DISPOSABLE) ×3
GOWN STRL REUS W/TWL XL LVL3 (GOWN DISPOSABLE) ×3
ILLUMINATOR WAVEGUIDE N/F (MISCELLANEOUS) IMPLANT
KIT MARKER MARGIN INK (KITS) ×3 IMPLANT
LIGHT WAVEGUIDE WIDE FLAT (MISCELLANEOUS) IMPLANT
NDL HYPO 25X1 1.5 SAFETY (NEEDLE) ×2 IMPLANT
NDL SAFETY ECLIPSE 18X1.5 (NEEDLE) ×1 IMPLANT
NEEDLE HYPO 18GX1.5 SHARP (NEEDLE) ×3
NEEDLE HYPO 25X1 1.5 SAFETY (NEEDLE) ×6 IMPLANT
NS IRRIG 1000ML POUR BTL (IV SOLUTION) ×3 IMPLANT
PACK BASIN DAY SURGERY FS (CUSTOM PROCEDURE TRAY) ×3 IMPLANT
PAD ALCOHOL SWAB (MISCELLANEOUS) ×3 IMPLANT
PENCIL BUTTON HOLSTER BLD 10FT (ELECTRODE) ×3 IMPLANT
SHEET MEDIUM DRAPE 40X70 STRL (DRAPES) ×3 IMPLANT
SLEEVE SCD COMPRESS KNEE MED (MISCELLANEOUS) ×3 IMPLANT
SPONGE LAP 18X18 RF (DISPOSABLE) IMPLANT
SPONGE LAP 4X18 RFD (DISPOSABLE) ×3 IMPLANT
SUT MNCRL AB 4-0 PS2 18 (SUTURE) ×6 IMPLANT
SUT SILK 2 0 SH (SUTURE) ×3 IMPLANT
SUT VIC AB 2-0 CT1 27 (SUTURE)
SUT VIC AB 2-0 CT1 TAPERPNT 27 (SUTURE) IMPLANT
SUT VIC AB 3-0 SH 27 (SUTURE)
SUT VIC AB 3-0 SH 27X BRD (SUTURE) IMPLANT
SUT VICRYL 3-0 CR8 SH (SUTURE) ×3 IMPLANT
SYR 10ML LL (SYRINGE) ×6 IMPLANT
TOWEL GREEN STERILE FF (TOWEL DISPOSABLE) ×6 IMPLANT
TRAY FAXITRON CT DISP (TRAY / TRAY PROCEDURE) ×3 IMPLANT
TUBE CONNECTING 20'X1/4 (TUBING) ×1
TUBE CONNECTING 20X1/4 (TUBING) ×2 IMPLANT
YANKAUER SUCT BULB TIP NO VENT (SUCTIONS) ×3 IMPLANT

## 2018-06-12 NOTE — Anesthesia Procedure Notes (Signed)
Procedure Name: LMA Insertion Performed by: Jahmad Petrich W, CRNA Pre-anesthesia Checklist: Patient identified, Emergency Drugs available, Suction available and Patient being monitored Patient Re-evaluated:Patient Re-evaluated prior to induction Oxygen Delivery Method: Circle system utilized Preoxygenation: Pre-oxygenation with 100% oxygen Induction Type: IV induction Ventilation: Mask ventilation without difficulty LMA: LMA inserted LMA Size: 4.0 Number of attempts: 1 Placement Confirmation: positive ETCO2 Tube secured with: Tape Dental Injury: Teeth and Oropharynx as per pre-operative assessment        

## 2018-06-12 NOTE — Interval H&P Note (Signed)
History and Physical Interval Note:  06/12/2018 7:22 AM  Caitlyn Mccoy  has presented today for surgery, with the diagnosis of invasive cancer right breast upper inner quadrant, estrogen receptor positive  The various methods of treatment have been discussed with the patient and family. After consideration of risks, benefits and other options for treatment, the patient has consented to  Procedure(s) with comments: RIGHT BREAST LUMPECTOMY WITH RADIOACTIVE SEED AND  RIGHT AXILLARY  DEEP SENTINEL LYMPH NODE BIOPSY WITH BLUE DYE INJECTION ERAS PATHWAY (Right) - PECTORAL BLOCK as a surgical intervention .  The patient's history has been reviewed, patient examined, no change in status, stable for surgery.  I have reviewed the patient's chart and labs.  Questions were answered to the patient's satisfaction.     Adin Hector

## 2018-06-12 NOTE — Anesthesia Postprocedure Evaluation (Signed)
Anesthesia Post Note  Patient: LEISEL PINETTE  Procedure(s) Performed: RIGHT BREAST LUMPECTOMY WITH RADIOACTIVE SEED AND  RIGHT AXILLARY  DEEP SENTINEL LYMPH NODE BIOPSY WITH BLUE DYE INJECTION ERAS PATHWAY (Right Breast)     Patient location during evaluation: PACU Anesthesia Type: General Level of consciousness: awake and alert Pain management: pain level controlled Vital Signs Assessment: post-procedure vital signs reviewed and stable Respiratory status: spontaneous breathing, nonlabored ventilation, respiratory function stable and patient connected to nasal cannula oxygen Cardiovascular status: blood pressure returned to baseline and stable Postop Assessment: no apparent nausea or vomiting Anesthetic complications: no    Last Vitals:  Vitals:   06/12/18 1145 06/12/18 1215  BP: (!) 145/84 (!) 158/71  Pulse: 62 64  Resp: 14 16  Temp:  36.7 C  SpO2: 97% 99%    Last Pain:  Vitals:   06/12/18 1215  TempSrc:   PainSc: 0-No pain                 Safi Culotta DAVID

## 2018-06-12 NOTE — Progress Notes (Signed)
Nuc med inj performed by nuc med staff. Pt tol well with no additional sedation required. Will see if family is here and bring them to bedside.

## 2018-06-12 NOTE — Anesthesia Procedure Notes (Signed)
Anesthesia Regional Block: Pectoralis block   Pre-Anesthetic Checklist: ,, timeout performed, Correct Patient, Correct Site, Correct Laterality, Correct Procedure, Correct Position, site marked, Risks and benefits discussed,  Surgical consent,  Pre-op evaluation,  At surgeon's request and post-op pain management  Laterality: Right  Prep: chloraprep       Needles:  Injection technique: Single-shot     Needle Length: 9cm  Needle Gauge: 21     Additional Needles:   Narrative:  Start time: 06/12/2018 8:57 AM End time: 06/12/2018 9:07 AM Injection made incrementally with aspirations every 5 mL.  Performed by: Personally  Anesthesiologist: Lillia Abed, MD  Additional Notes: Monitors applied. Patient sedated. Sterile prep and drape,hand hygiene and sterile gloves were used. Relevant anatomy identified.Needle position confirmed.Local anesthetic injected incrementally after negative aspiration. Local anesthetic spread visualized. Vascular puncture avoided. No complications. Image printed for medical record.The patient tolerated the procedure well.

## 2018-06-12 NOTE — Transfer of Care (Signed)
Immediate Anesthesia Transfer of Care Note  Patient: Caitlyn Mccoy  Procedure(s) Performed: RIGHT BREAST LUMPECTOMY WITH RADIOACTIVE SEED AND  RIGHT AXILLARY  DEEP SENTINEL LYMPH NODE BIOPSY WITH BLUE DYE INJECTION ERAS PATHWAY (Right Breast)  Patient Location: PACU  Anesthesia Type:General and Regional  Level of Consciousness: awake and sedated  Airway & Oxygen Therapy: Patient Spontanous Breathing and Patient connected to face mask oxygen  Post-op Assessment: Report given to RN and Post -op Vital signs reviewed and stable  Post vital signs: Reviewed and stable  Last Vitals:  Vitals Value Taken Time  BP 150/90 06/12/2018 11:05 AM  Temp    Pulse 78 06/12/2018 11:06 AM  Resp 14 06/12/2018 11:06 AM  SpO2 100 % 06/12/2018 11:06 AM  Vitals shown include unvalidated device data.  Last Pain:  Vitals:   06/12/18 0907  TempSrc:   PainSc: 0-No pain         Complications: No apparent anesthesia complications

## 2018-06-12 NOTE — Progress Notes (Signed)
Assisted Dr. Ossey with right, ultrasound guided, pectoralis block. Side rails up, monitors on throughout procedure. See vital signs in flow sheet. Tolerated Procedure well. 

## 2018-06-12 NOTE — Anesthesia Preprocedure Evaluation (Signed)
Anesthesia Evaluation  Patient identified by MRN, date of birth, ID band Patient awake    Reviewed: Allergy & Precautions, NPO status , Patient's Chart, lab work & pertinent test results  Airway Mallampati: I  TM Distance: >3 FB Neck ROM: Full    Dental   Pulmonary former smoker,    Pulmonary exam normal        Cardiovascular Normal cardiovascular exam     Neuro/Psych    GI/Hepatic   Endo/Other    Renal/GU      Musculoskeletal   Abdominal   Peds  Hematology   Anesthesia Other Findings   Reproductive/Obstetrics                             Anesthesia Physical Anesthesia Plan  ASA: II  Anesthesia Plan: General   Post-op Pain Management:    Induction: Intravenous  PONV Risk Score and Plan: 3 and Ondansetron, Midazolam and Treatment may vary due to age or medical condition  Airway Management Planned: LMA  Additional Equipment:   Intra-op Plan:   Post-operative Plan: Extubation in OR  Informed Consent: I have reviewed the patients History and Physical, chart, labs and discussed the procedure including the risks, benefits and alternatives for the proposed anesthesia with the patient or authorized representative who has indicated his/her understanding and acceptance.       Plan Discussed with: CRNA and Surgeon  Anesthesia Plan Comments:         Anesthesia Quick Evaluation

## 2018-06-12 NOTE — Anesthesia Procedure Notes (Signed)
Procedure Name: Intubation Performed by: Terrance Mass, CRNA Pre-anesthesia Checklist: Patient identified, Emergency Drugs available, Suction available and Patient being monitored Patient Re-evaluated:Patient Re-evaluated prior to induction Oxygen Delivery Method: Circle system utilized Preoxygenation: Pre-oxygenation with 100% oxygen Induction Type: IV induction Ventilation: Mask ventilation without difficulty Tube type: Oral Tube size: 7.0 mm Number of attempts: 1 Airway Equipment and Method: Stylet and Oral airway Placement Confirmation: ETT inserted through vocal cords under direct vision,  positive ETCO2 and breath sounds checked- equal and bilateral Secured at: 21 cm Tube secured with: Tape Dental Injury: Teeth and Oropharynx as per pre-operative assessment

## 2018-06-12 NOTE — Anesthesia Procedure Notes (Signed)
Performed by: Diva Lemberger W, CRNA       

## 2018-06-12 NOTE — Op Note (Signed)
Patient Name:           Caitlyn Mccoy   Date of Surgery:        06/12/2018  Pre op Diagnosis:      Invasive cancer right breast, upper inner quadrant, estrogen receptor positive                                       3.5 cm hematoma right breast, upper inner quadrant  Post op Diagnosis:    Same  Procedure:                Inject blue dye right breast                                     Right breast lumpectomy with radioactive seed localization                                     Right axillary deep sentinel lymph node biopsy  Surgeon:                     Edsel Petrin. Dalbert Batman, M.D., FACS  Assistant:          OR staff   Operative Indications:         This is a very pleasant 68 year old female. She is referred by Caitlyn Mccoy, her gynecologist and by Caitlyn Mccoy at the [facility] for evaluation and management of a invasive cancer in the right breast upper inner quadrant. Caitlyn Mccoy is her PCP who she sees occasionally.     She has no prior history of breast problems. He gets annual mammograms. Mammograms recently showed a 7 mm mass in the right breast upper inner quadrant. No other mass in either breast. Ultrasound of the right axilla was negative. 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 ER and PR 100% strongly positive. Ki-67 12%. She has a hematoma     This history is significant for cerebrovascular accident in 2000. That was followed by a right carotid endarterectomy. She has no residual. She is not on any blood thinners now. BMI 35. Family history reveals mother had diabetes. Father had a heart attack. No family history of breast, ovarian, or prostate cancer.     We had a very long discussion. She is strongly motivated for breast conservation and I think she is an excellent candidate for that. We talked about lumpectomy, radiation therapy, sentinel node biopsy. We talked about mastectomy as an alternative. She has seen medical  oncology and radiation oncology      She'll be scheduled for injection blue dye right breast, right breast lumpectomy with radioactive seed localization, right axillary deep sentinel lymph node biopsy.  She knows that she has a 3.5 cm hematoma in the right breast related to her original core biopsy.  This is been discussed with radiology.  We decided to go ahead with the surgery and the radioactive seed was placed yesterday and it is immediately adjacent to the biopsy clip.  The patient knows that we will have to make a somewhat larger incision and take a bit more tissue because of the hematomashe agrees with this plan.  Operative Findings:  The cancer and the hematoma were in the upper inner quadrant of the right breast.  The hematoma was at least 3.5 cm in size and was completely excised.  The anterior margin is the skin in the superior aspect.  The broad posterior margin is the pectoralis muscle.  The specimen mammogram looked very good.  We used the 3D Faxitron and we could see that the seed and the marker clip were in the center of the specimen and not close to the margin in any direction.  There were a couple of matted sentinel nodes that were sent but that was all we found  Procedure in Detail:          Following the induction of general endotracheal anesthesia a surgical timeout was performed.  Intravenous antibiotics were given.  Following alcohol prep I injected 5 cc of dilute methylene blue into the right breast, subareolar area, and massaged the breast for a couple of minutes.  The right breast, entire right chest wall and right axilla were then prepped and draped in a sterile fashion.  0.5% Marcaine with epinephrine was used as a local infiltration anesthetic.    Using the neoprobe I found the radioactive signal in the upper inner quadrant of the right breast.  Using a marking pen I planned the incision.  The hematoma was palpable.  The incision was made with a knife.  Lumpectomy was  performed with the electrocautery and neoprobe.  The anterior margin superior to the incision is essentially the dermis.  The posterior margin is essentially the pectoralis muscle.  The specimen was removed and marked with silk sutures and a 6 color ink kit to orient the pathologist.  Specimen mammogram looked good as described above.  The specimen was sent to the lab where the seed was retrieved.  The wound was irrigated.  Hemostasis was excellent.  5 metal marker clips were placed in the walls of the lumpectomy cavity.  The lumpectomy was closed with multiple interrupted sutures of 3-0 Vicryl and the skin closed with a running subcuticular 4-0 Monocryl and Dermabond.  There was a little bit of volume loss notable.     The neoprobe was set on the technetium 99 setting.  Incision was made in the right axilla at the hairline.  Dissection was carried down through the subcutaneous tissue and the clavipectoral fascia was incised.  Using the neoprobe I found two sentinel lymph nodes matted together with each other with radioactivity and blue dye.  After these were removed I found no more radioactivity or blue lymph nodes.  This wound was irrigated.  Hemostasis was excellent.  The clavipectoral fascia was closed with 3-0 Vicryl sutures.  The skin was closed with a running subcuticular 4-0 Monocryl and Dermabond.  Dry bandages and a breast binder were placed.  The patient tolerated the procedure well was taken to PACU in stable condition.  EBL 20 cc.  Counts correct.  Complications none.   Addendum: I logged onto the PMP aware website and reviewed her prescription medication history     Caitlyn Mccoy M. Dalbert Batman, M.D., FACS General and Minimally Invasive Surgery Breast and Colorectal Surgery  06/12/2018 10:58 AM

## 2018-06-12 NOTE — Discharge Instructions (Signed)
[facility] Office Phone Number [phone]  BREAST BIOPSY/ LUMPECTOMY: POST OP INSTRUCTIONS  Always review your discharge instruction sheet given to you by the facility where your surgery was performed.  IF YOU HAVE DISABILITY OR FAMILY LEAVE FORMS, YOU MUST BRING THEM TO THE OFFICE FOR PROCESSING.  DO NOT GIVE THEM TO YOUR DOCTOR.  1. A prescription for pain medication may be given to you upon discharge.  Take your pain medication as prescribed, if needed.  If narcotic pain medicine is not needed, then you may take acetaminophen (Tylenol) or ibuprofen (Advil) as needed. No Tylenol or Ibuprofen until 2:00pm if needed 2. Take your usually prescribed medications unless otherwise directed 3. If you need a refill on your pain medication, please contact your pharmacy.  They will contact our office to request authorization.  Prescriptions will not be filled after 5pm or on week-ends. 4. You should eat very light the first 24 hours after surgery, such as soup, crackers, pudding, etc.  Resume your normal diet the day after surgery. 5. Most patients will experience some swelling and bruising in the breast.  Ice packs and a good support bra will help.  Swelling and bruising can take several days to resolve.  6. It is common to experience some constipation if taking pain medication after surgery.  Increasing fluid intake and taking a stool softener will usually help or prevent this problem from occurring.  A mild laxative (Milk of Magnesia or Miralax) should be taken according to package directions if there are no bowel movements after 48 hours. 7. Unless discharge instructions indicate otherwise, you may remove your bandages 24-48 hours after surgery, and you may shower at that time.  You may have steri-strips (small skin tapes) in place directly over the incision.  These strips should be left on the skin for 7-10 days.  If your surgeon used skin glue on the incision, you may shower in 24  hours.  The glue will flake off over the next 2-3 weeks.  Any sutures or staples will be removed at the office during your follow-up visit. 8. ACTIVITIES:  You may resume regular daily activities (gradually increasing) beginning the next day.  Wearing a good support bra or sports bra minimizes pain and swelling.  You may have sexual intercourse when it is comfortable. a. You may drive when you no longer are taking prescription pain medication, you can comfortably wear a seatbelt, and you can safely maneuver your car and apply brakes. b. RETURN TO WORK:  ______________________________________________________________________________________ 9. You should see your doctor in the office for a follow-up appointment approximately two weeks after your surgery.  Your doctors nurse will typically make your follow-up appointment when she calls you with your pathology report.  Expect your pathology report 2-3 business days after your surgery.  You may call to check if you do not hear from [place] after three days. 10. OTHER INSTRUCTIONS: _______________________________________________________________________________________________ _____________________________________________________________________________________________________________________________________ _____________________________________________________________________________________________________________________________________ _____________________________________________________________________________________________________________________________________  WHEN TO CALL YOUR DOCTOR: 1. Fever over 101.0 2. Nausea and/or vomiting. 3. Extreme swelling or bruising. 4. Continued bleeding from incision. 5. Increased pain, redness, or drainage from the incision.  The clinic staff is available to answer your questions during regular business hours.  Please dont hesitate to call and ask to speak to one of the nurses for clinical concerns.  If you have a  medical emergency, go to the nearest emergency room or call 911.  A surgeon from [facility] is always on call at the hospital.  For further questions, please visit [email or web address]           Managing Your Pain After Surgery Without Opioids    Thank you for participating in our program to help patients manage their pain after surgery without opioids. This is part of our effort to provide you with the best care possible, without exposing you or your family to the risk that opioids pose.  What pain can I expect after surgery? You can expect to have some pain after surgery. This is normal. The pain is typically worse the day after surgery, and quickly begins to get better. Many studies have found that many patients are able to manage their pain after surgery with Over-the-Counter (OTC) medications such as Tylenol and Motrin. If you have a condition that does not allow you to take Tylenol or Motrin, notify your surgical team.  How will I manage my pain? The best strategy for controlling your pain after surgery is around the clock pain control with Tylenol (acetaminophen) and Motrin (ibuprofen or Advil). Alternating these medications with each other allows you to maximize your pain control. In addition to Tylenol and Motrin, you can use heating pads or ice packs on your incisions to help reduce your pain.  How will I alternate your regular strength over-the-counter pain medication? You will take a dose of pain medication every three hours. ; Start by taking 650 mg of Tylenol (2 pills of 325 mg) ; 3 hours later take 600 mg of Motrin (3 pills of 200 mg) ; 3 hours after taking the Motrin take 650 mg of Tylenol ; 3 hours after that take 600 mg of Motrin.   - 1 -  See example - if your first dose of Tylenol is at 12:00 PM   12:00 PM Tylenol 650 mg (2 pills of 325 mg)  3:00 PM Motrin 600 mg (3 pills of 200 mg)  6:00 PM Tylenol 650 mg (2 pills of 325 mg)    9:00 PM Motrin 600 mg (3 pills of 200 mg)  Continue alternating every 3 hours   We recommend that you follow this schedule around-the-clock for at least 3 days after surgery, or until you feel that it is no longer needed. Use the table on the last page of this handout to keep track of the medications you are taking. Important: Do not take more than 3000mg  of Tylenol or 3200mg  of Motrin in a 24-hour period. Do not take ibuprofen/Motrin if you have a history of bleeding stomach ulcers, severe kidney disease, &/or actively taking a blood thinner  What if I still have pain? If you have pain that is not controlled with the over-the-counter pain medications (Tylenol and Motrin or Advil) you might have what we call breakthrough pain. You will receive a prescription for a small amount of an opioid pain medication such as Oxycodone, Tramadol, or Tylenol with Codeine. Use these opioid pills in the first 24 hours after surgery if you have breakthrough pain. Do not take more than 1 pill every 4-6 hours.  If you still have uncontrolled pain after using all opioid pills, don't hesitate to call our staff using the number provided. We will help make sure you are managing your pain in the best way possible, and if necessary, we can provide a prescription for additional pain medication.   Day 1    Time  Name of Medication Number of pills taken  Amount of Acetaminophen  Pain Level   Comments  AM PM  AM PM       AM PM       AM PM       AM PM       AM PM       AM PM       AM PM       Total Daily amount of Acetaminophen Do not take more than  3,000 mg per day      Day 2    Time  Name of Medication Number of pills taken  Amount of Acetaminophen  Pain Level   Comments  AM PM       AM PM       AM PM       AM PM       AM PM       AM PM       AM PM       AM PM       Total Daily amount of Acetaminophen Do not take more than  3,000 mg per day      Day 3    Time  Name of  Medication Number of pills taken  Amount of Acetaminophen  Pain Level   Comments  AM PM       AM PM       AM PM       AM PM          AM PM       AM PM       AM PM       AM PM       Total Daily amount of Acetaminophen Do not take more than  3,000 mg per day      Day 4    Time  Name of Medication Number of pills taken  Amount of Acetaminophen  Pain Level   Comments  AM PM       AM PM       AM PM       AM PM       AM PM       AM PM       AM PM       AM PM       Total Daily amount of Acetaminophen Do not take more than  3,000 mg per day      Day 5    Time  Name of Medication Number of pills taken  Amount of Acetaminophen  Pain Level   Comments  AM PM       AM PM       AM PM       AM PM       AM PM       AM PM       AM PM       AM PM       Total Daily amount of Acetaminophen Do not take more than  3,000 mg per day       Day 6    Time  Name of Medication Number of pills taken  Amount of Acetaminophen  Pain Level  Comments  AM PM       AM PM       AM PM       AM PM       AM PM       AM PM       AM PM       AM PM  Total Daily amount of Acetaminophen Do not take more than  3,000 mg per day      Day 7    Time  Name of Medication Number of pills taken  Amount of Acetaminophen  Pain Level   Comments  AM PM       AM PM       AM PM       AM PM       AM PM       AM PM       AM PM       AM PM       Total Daily amount of Acetaminophen Do not take more than  3,000 mg per day        For additional information about how and where to safely dispose of unused opioid medications - [employer]  Disclaimer: This document contains information and/or instructional materials adapted from [facility] for the typical patient with your condition. It does not replace medical advice from your health care provider because your experience may differ from that of the typical patient. Talk to your health care  provider if you have any questions about this document, your condition or your treatment plan. Adapted from [place] Instructions  Activity: Get plenty of rest for the remainder of the day. A responsible individual must stay with you for 24 hours following the procedure.  For the next 24 hours, DO NOT: -Drive a car -Paediatric nurse -Drink alcoholic beverages -Take any medication unless instructed by your physician -Make any legal decisions or sign important papers.  Meals: Start with liquid foods such as gelatin or soup. Progress to regular foods as tolerated. Avoid greasy, spicy, heavy foods. If nausea and/or vomiting occur, drink only clear liquids until the nausea and/or vomiting subsides. Call your physician if vomiting continues.  Special Instructions/Symptoms: Your throat may feel dry or sore from the anesthesia or the breathing tube placed in your throat during surgery. If this causes discomfort, gargle with warm salt water. The discomfort should disappear within 24 hours.  If you had a scopolamine patch placed behind your ear for the management of post- operative nausea and/or vomiting:  1. The medication in the patch is effective for 72 hours, after which it should be removed.  Wrap patch in a tissue and discard in the trash. Wash hands thoroughly with soap and water. 2. You may remove the patch earlier than 72 hours if you experience unpleasant side effects which may include dry mouth, dizziness or visual disturbances. 3. Avoid touching the patch. Wash your hands with soap and water after contact with the patch.

## 2018-06-15 ENCOUNTER — Encounter (HOSPITAL_BASED_OUTPATIENT_CLINIC_OR_DEPARTMENT_OTHER): Payer: Self-pay | Admitting: General Surgery

## 2018-06-15 NOTE — Progress Notes (Signed)
Inform patient of Pathology report,. Tell her she had 2 small cancers, as expected Resection margins are negative and she will not need any further surgery Sentinel node was negative Good news I will discuss this with her in more detail at her next office visit Let me know that you contacted her  hmi

## 2018-06-23 NOTE — Progress Notes (Signed)
Location of Breast Cancer: Right Breast  Histology per Pathology Report:  05/26/18 Diagnosis Breast, right, needle core biopsy, upper inner - INVASIVE DUCTAL CARCINOMA WITH EXTRACELLULAR MUCIN. - SEE MICROSCOPIC DESCRIPTION.  Receptor Status: ER(100%), PR (100%), Her2-neu (NEG), Ki-(12%)  06/12/18 Diagnosis 1. Breast, lumpectomy, Right w/seed - INVASIVE DUCTAL CARCINOMA WITH EXTRACELLULAR MUCIN, GRADE I. SEE NOTE. - DUCTAL CARCINOMA IN SITU, INTERMEDIATE NUCLEAR GRADE. - PROMINENT PREVIOUS PROCEDURE RELATED CHANGES INCLUDING HEMORRHAGE, FAT NECROSIS AND GIANT CELL RESPONSE. - ALL RESECTION MARGINS ARE NEGATIVE FOR CARCINOMA. - NEGATIVE FOR LYMPHOVASCULAR OR PERINEURAL INVASION - SEE ONCOLOGY TABLE. 2. Lymph node, sentinel, biopsy, Right Axillary - LYMPH NODE, NEGATIVE FOR CARCINOMA (0/1).  Did patient present with symptoms or was this found on screening mammography?: It was found on a screening mammogram.   Past/Anticipated interventions by surgeon, if any: 06/12/18 Procedure:                Inject blue dye right breast                                     Right breast lumpectomy with radioactive seed localization                                     Right axillary deep sentinel lymph node biopsy Surgeon:                     Edsel Petrin. Dalbert Batman, M.D., [facility]  Past/Anticipated interventions by medical oncology, if any: Dr. Lindi Adie earlier today Treatment plan: 1.  Adjuvant radiation therapy 2.  Follow-up adjuvant antiestrogen therapy  Lymphedema issues, if any:  She denies. She has good arm mobility.  Pain issues, if any:  She denies.   SAFETY ISSUES:  Prior radiation? No  Pacemaker/ICD? No  Possible current pregnancy? No  Is the patient on methotrexate? No  Current Complaints / other details:    BP (!) 160/93 (BP Location: Left Arm, Patient Position: Sitting)   Pulse (!) 58   Temp 98.2 F (36.8 C) (Oral)   Resp 20   Ht 4' 11.5" (1.511 m)   Wt 174 lb 3.2 oz (79 kg)    SpO2 100%   BMI 34.60 kg/m    Wt Readings from Last 3 Encounters:  06/30/18 174 lb 3.2 oz (79 kg)  06/30/18 174 lb 11.2 oz (79.2 kg)  06/12/18 175 lb 7.8 oz (79.6 kg)      Jemal Miskell, Stephani Police, RN 06/23/2018,10:54 AM

## 2018-06-26 NOTE — Progress Notes (Signed)
[facility] CONSULT NOTE  Patient Care Team: Patient, No Pcp Per as PCP - General (General Practice)  CHIEF COMPLAINTS/PURPOSE OF CONSULTATION: Newly diagnosed breast cancer s/p lumpectomy  HISTORY OF PRESENTING ILLNESS:  Caitlyn Mccoy 68 y.o. female is here because of recent diagnosis of invasive ductal carcinoma with DCIS of the right breast. The cancer was detected on a routine screening mammogram on 03/30/18 and was not palpable prior to diagnosis. A diagnostic mammogram on 04/27/18 showed indeterminate right breast masses measuring 89m and 439m A biopsy from 05/26/18 showed the cancer to be grade 1 IDC with extracellular mucin, HER2 negative, ER 100%, PR 100%, Ki67 12%. An US[place]orean 06/01/18 of the right axilla so no evidence of lymphadenopathy. She had a right lumpectomy on 06/12/18 for which pathology confirmed 0.3cm, grade 1 IDC with DCIS, with negative margins and no lymph node involvement.   She presents to the clinic today with her daughter. She denies any known family history of breast, ovarian, prostate, or pancreatic cancer. She is recovering well from surgery, but reports soreness under her right arm. She denies any hot flashes. The patient is concerned about coming for radiation daily and her ability to keep working. She reviewed her medication list with me.   I reviewed her records extensively and collaborated the history with the patient.  SUMMARY OF ONCOLOGIC HISTORY:   Malignant neoplasm of upper-inner quadrant of right breast in female, estrogen receptor positive ([facility]  04/27/2018 Initial Diagnosis    Screening mammogram detected right breast masses upper inner quadrant 7 mm and 4 mm, biopsy revealed grade 1 IDC ER 100%, PR 100%, HER-2 negative IHC 0, Ki-67 12%, T1BN0 stage Ia clinical stage    06/12/2018 Surgery    Right lumpectomy: 0.3 cm IDC with extracellular mucin, grade 1, with intermediate grade DCIS, margins negative, negative for lymphovascular or  perineural invasion, 0/1 lymph node negative, ER 100%, PR 100%, HER-2 negative IHC 0, Ki-67 12%, T1 a N0 stage Ia     MEDICAL HISTORY:  Past Medical History:  Diagnosis Date  . Breast cancer of upper-inner quadrant of right female breast [place]  . Yeast infection     SURGICAL HISTORY: Past Surgical History:  Procedure Laterality Date  . BREAST LUMPECTOMY WITH RADIOACTIVE SEED AND SENTINEL LYMPH NODE BIOPSY Right 06/12/2018   Procedure: RIGHT BREAST LUMPECTOMY WITH RADIOACTIVE SEED AND  RIGHT AXILLARY  DEEP SENTINEL LYMPH NODE BIOPSY WITH BLUE DYE INJECTION ERAS PATHWAY;  Surgeon: InFanny SkatesMD;  [facility] Service: General;  Laterality: Right;  PECTORAL BLOCK  . CAROTID ENDARTERECTOMY  2001  . TONSILLECTOMY    . WISDOM TOOTH EXTRACTION      SOCIAL HISTORY: Social History   Socioeconomic History  . Marital status: Single    Spouse name: Not on file  . Number of children: Not on file  . Years of education: Not on file  . Highest education level: Not on file  Occupational History  . Not on file  Social Needs  . Financial resource strain: Not on file  . Food insecurity:    Worry: Not on file    Inability: Not on file  . Transportation needs:    Medical: Not on file    Non-medical: Not on file  Tobacco Use  . Smoking status: Former SmResearch scientist (life sciences). Smokeless tobacco: Never Used  Substance and Sexual Activity  . Alcohol use: No  . Drug use: No  . Sexual activity:  Not Currently  Lifestyle  . Physical activity:    Days per week: Not on file    Minutes per session: Not on file  . Stress: Not on file  Relationships  . Social connections:    Talks on phone: Not on file    Gets together: Not on file    Attends religious service: Not on file    Active member of club or organization: Not on file    Attends meetings of clubs or organizations: Not on file    Relationship status: Not on file  . Intimate partner violence:    Fear of current  or ex partner: Not on file    Emotionally abused: Not on file    Physically abused: Not on file    Forced sexual activity: Not on file  Other Topics Concern  . Not on file  Social History Narrative  . Not on file    FAMILY HISTORY: Family History  Problem Relation Age of Onset  . Breast cancer Neg Hx     ALLERGIES:  is allergic to penicillins and sulfa antibiotics.  MEDICATIONS:  No current outpatient medications on file.   No current facility-administered medications for this visit.     REVIEW OF SYSTEMS:   Constitutional: Denies fevers, chills or abnormal night sweats Eyes: Denies blurriness of vision, double vision or watery eyes Ears, nose, mouth, throat, and face: Denies mucositis or sore throat Respiratory: Denies cough, dyspnea or wheezes Cardiovascular: Denies palpitation, chest discomfort or lower extremity swelling Gastrointestinal:  Denies nausea, heartburn or change in bowel habits Skin: Denies abnormal skin rashes Lymphatics: Denies new lymphadenopathy or easy bruising Neurological:Denies numbness, tingling or new weaknesses Behavioral/Psych: Mood is stable, no new changes  Breast: Denies any palpable lumps or discharge (+) soreness under right arm All other systems were reviewed with the patient and are negative.  PHYSICAL EXAMINATION: ECOG PERFORMANCE STATUS: 1 - Symptomatic but completely ambulatory  Vitals:   06/30/18 0821  BP: (!) 174/109  Pulse: 69  Resp: 18  Temp: 98.4 F (36.9 C)  SpO2: 100%   Filed Weights   06/30/18 0821  Weight: 174 lb 11.2 oz (79.2 kg)    GENERAL:alert, no distress and comfortable SKIN: skin color, texture, turgor are normal, no rashes or significant lesions EYES: normal, conjunctiva are pink and non-injected, sclera clear OROPHARYNX:no exudate, no erythema and lips, buccal mucosa, and tongue normal  NECK: supple, thyroid normal size, non-tender, without nodularity LYMPH:  no palpable lymphadenopathy in the  cervical, axillary or inguinal LUNGS: clear to auscultation and percussion with normal breathing effort HEART: regular rate & rhythm and no murmurs and no lower extremity edema ABDOMEN:abdomen soft, non-tender and normal bowel sounds Musculoskeletal:no cyanosis of digits and no clubbing  PSYCH: alert & oriented x 3 with fluent speech NEURO: no focal motor/sensory deficits  LABORATORY DATA:  I have reviewed the data as listed Lab Results  Component Value Date   WBC 5.1 06/10/2018   HGB 13.5 06/10/2018   HCT 41.0 06/10/2018   MCV 91.9 06/10/2018   PLT 180 06/10/2018   Lab Results  Component Value Date   NA 145 06/10/2018   K 4.4 06/10/2018   CL 112 (H) 06/10/2018   CO2 22 06/10/2018    RADIOGRAPHIC STUDIES: I have personally reviewed the radiological reports and agreed with the findings in the report.  ASSESSMENT AND PLAN:  Malignant neoplasm of upper-inner quadrant of right breast in female, estrogen receptor positive ([place]) 06/12/2018:Right lumpectomy: 0.3 cm IDC  with extracellular mucin, grade 1, with intermediate grade DCIS, margins negative, negative for lymphovascular or perineural invasion, 0/1 lymph node negative, ER 100%, PR 100%, HER-2 negative IHC 0, Ki-67 12%, T1 a N0 stage Ia  Pathology counseling: I discussed the final pathology report of the patient provided  a copy of this report. I discussed the margins as well as lymph node surgeries. We also discussed the final staging along with previously performed ER/PR and HER-2/neu testing.  Treatment plan: 1.  Adjuvant radiation therapy 2.  Follow-up adjuvant antiestrogen therapy  Anastrozole counseling: We discussed the risks and benefits of anti-estrogen therapy with aromatase inhibitors. These include but not limited to insomnia, hot flashes, mood changes, vaginal dryness, bone density loss, and weight gain. We strongly believe that the benefits far outweigh the risks. Patient understands these risks and consented to  starting treatment. Planned treatment duration is 5-7 years.  Since the patient has no family history of breast ovarian prostate pancreatic or melanoma, there is no indication for genetic testing.  Return to clinic at the end of radiation to start antiestrogen therapy  All questions were answered. The patient knows to call the clinic with any problems, questions or concerns.   Nicholas Lose, MD 06/30/2018   Julious Oka Dorshimer, am acting as scribe for Nicholas Lose, MD.  I have reviewed the above documentation for accuracy and completeness, and I agree with the above.

## 2018-06-30 ENCOUNTER — Ambulatory Visit
Admission: RE | Admit: 2018-06-30 | Discharge: 2018-06-30 | Disposition: A | Discharge status: Home / Self Care | Payer: BC Managed Care – PPO | Source: Ambulatory Visit | Attending: Radiation Oncology | Admitting: Radiation Oncology

## 2018-06-30 ENCOUNTER — Inpatient Hospital Stay: Payer: BC Managed Care – PPO | Attending: Hematology and Oncology | Admitting: Hematology and Oncology

## 2018-06-30 ENCOUNTER — Other Ambulatory Visit: Payer: Self-pay

## 2018-06-30 ENCOUNTER — Encounter: Payer: Self-pay | Admitting: *Deleted

## 2018-06-30 ENCOUNTER — Encounter: Payer: Self-pay | Admitting: Radiation Oncology

## 2018-06-30 ENCOUNTER — Telehealth: Payer: Self-pay | Admitting: Hematology and Oncology

## 2018-06-30 VITALS — BP 160/93 | HR 58 | Temp 98.2°F | Resp 20 | Ht 59.5 in | Wt 174.2 lb

## 2018-06-30 DIAGNOSIS — Z87891 Personal history of nicotine dependence: Secondary | ICD-10-CM | POA: Insufficient documentation

## 2018-06-30 DIAGNOSIS — Z923 Personal history of irradiation: Secondary | ICD-10-CM | POA: Diagnosis not present

## 2018-06-30 DIAGNOSIS — C50211 Malignant neoplasm of upper-inner quadrant of right female breast: Secondary | ICD-10-CM

## 2018-06-30 DIAGNOSIS — Z170 Estrogen receptor positive status [ER+]: Secondary | ICD-10-CM | POA: Insufficient documentation

## 2018-06-30 HISTORY — DX: Diaphragmatic hernia without obstruction or gangrene: K44.9

## 2018-06-30 HISTORY — DX: Gastro-esophageal reflux disease without esophagitis: K21.9

## 2018-06-30 HISTORY — DX: Cerebral infarction, unspecified: I63.9

## 2018-06-30 HISTORY — DX: Essential (primary) hypertension: I10

## 2018-06-30 HISTORY — DX: Personal history of cervical dysplasia: Z87.410

## 2018-06-30 HISTORY — DX: Unspecified visual loss: H54.7

## 2018-06-30 NOTE — Assessment & Plan Note (Signed)
06/12/2018:Right lumpectomy: 0.3 cm IDC with extracellular mucin, grade 1, with intermediate grade DCIS, margins negative, negative for lymphovascular or perineural invasion, 0/1 lymph node negative, ER 100%, PR 100%, HER-2 negative IHC 0, Ki-67 12%, T1 a N0 stage Ia  Pathology counseling: I discussed the final pathology report of the patient provided  a copy of this report. I discussed the margins as well as lymph node surgeries. We also discussed the final staging along with previously performed ER/PR and HER-2/neu testing.  Treatment plan: 1.  Adjuvant radiation therapy 2.  Follow-up adjuvant antiestrogen therapy  Anastrozole counseling: We discussed the risks and benefits of anti-estrogen therapy with aromatase inhibitors. These include but not limited to insomnia, hot flashes, mood changes, vaginal dryness, bone density loss, and weight gain. We strongly believe that the benefits far outweigh the risks. Patient understands these risks and consented to starting treatment. Planned treatment duration is 5-7 years.  Return to clinic at the end of radiation to start antiestrogen therapy

## 2018-06-30 NOTE — Progress Notes (Signed)
Radiation Oncology         [phone]) [phone] ________________________________  Initial outpatient Consultation  Name: Caitlyn Mccoy MRN: [id number]  Date: 06/30/2018  DOB: 08/10/1950  [id number], No Pcp Per  Caitlyn Skates, MD   REFERRING PHYSICIAN: Fanny Skates, MD  DIAGNOSIS:    ICD-10-CM   1. Malignant neoplasm of upper-inner quadrant of right breast in female, estrogen receptor positive ([place]) C50.211    Z17.0    Stage IA Right Breast UIQ Invasive Ductal Carcinoma with DCIS, ER+ / PR+ / Her2-, Grade 1  CHIEF COMPLAINT: Here to discuss management of right breast cancer  HISTORY OF PRESENT Caitlyn Mccoy is a 68 y.o. female who presented with breast abnormality on the following imaging: screening mammogram on the date of 03/30/2018.  Symptoms, if any, at that time, were: none.   Ultrasound of breast on 04/27/2018 revealed indeterminate right breast masses measuring 7 mm and 4 mm.  Biopsy on date of 05/26/2018 showed invasive ductal carcinoma with extracellular mucin.  ER status: 100%, strong; PR status: 100%, strong; Her2 status negative; Grade 1/2. Right axilla ultrasound on 06/01/2018 showed no evidence of lymphadenopathy.  She proceeded to right breast lumpectomy under Dr. Dalbert Mccoy on 06/12/2018. Pathology from the procedure revealed: invasive ductal carcinoma with extracellular mucin, 0.3 cm, grade 1; ductal carcinoma in situ, intermediate nuclear grade; all resection margins are negative for carcinoma; negative for lymphovascular or perineural invasion; one lymph node, negative for carcinoma.  She met with Dr. Lindi Mccoy earlier today, 06/30/2018. He plans on adjuvant radiation therapy followed by adjuvant antiestrogen therapy.   She denies any lymphedema issues and any pain. She reports a history of cervical dysplasia. She underwent surgery to remove part of her cervix and cryosurgery. She is accompanied by her daughter.    PREVIOUS RADIATION THERAPY: No  PAST MEDICAL HISTORY:   has a past medical history of Blindness, Breast cancer of upper-inner quadrant of right female breast ([place]) (06/12/2018), GERD (gastroesophageal reflux disease), Hiatal hernia, Hypertension, Stroke ([place]) (2000), and Yeast infection.    PAST SURGICAL HISTORY: Past Surgical History:  Procedure Laterality Date  . BREAST LUMPECTOMY WITH RADIOACTIVE SEED AND SENTINEL LYMPH NODE BIOPSY Right 06/12/2018   Procedure: RIGHT BREAST LUMPECTOMY WITH RADIOACTIVE SEED AND  RIGHT AXILLARY  DEEP SENTINEL LYMPH NODE BIOPSY WITH BLUE DYE INJECTION ERAS PATHWAY;  Surgeon: Caitlyn Skates, MD;  Location: [facility];  Service: General;  Laterality: Right;  PECTORAL BLOCK  . CAROTID ENDARTERECTOMY  2001  . TONSILLECTOMY    . WISDOM TOOTH EXTRACTION      FAMILY HISTORY: no related family history reported  SOCIAL HISTORY:  reports that she quit smoking about 29 years ago. She has never used smokeless tobacco. She reports that she does not drink alcohol or use drugs.  ALLERGIES: Penicillins and Sulfa antibiotics  MEDICATIONS:  Current Outpatient Medications  Medication Sig Dispense Refill  . acetaminophen (TYLENOL) 325 MG tablet Take 650 mg by mouth every 6 (six) hours as needed.    . fluticasone (FLONASE) 50 MCG/ACT nasal spray     . HYDROcodone-acetaminophen (NORCO/VICODIN) 5-325 MG tablet hydrocodone 5 mg-acetaminophen 325 mg tablet     No current facility-administered medications for this encounter.     REVIEW OF SYSTEMS: A 10+ POINT REVIEW OF SYSTEMS WAS OBTAINED including neurology, dermatology, psychiatry, cardiac, respiratory, lymph, extremities, GI, GU, Musculoskeletal, constitutional, breasts, reproductive, HEENT.  All pertinent positives are noted in the HPI.  All others are negative.   PHYSICAL EXAM:  height  is 4' 11.5" (1.511 m) and weight is 174 lb 3.2 oz (79 kg). Her oral temperature is 98.2 F (36.8 C). Her blood pressure is 160/93 (abnormal) and her pulse is 58 (abnormal). Her  respiration is 20 and oxygen saturation is 100%.   General: Alert and oriented, in no acute distress HEENT: Head is normocephalic. Extraocular movements are intact. Oropharynx is clear. + dentures Neck: Neck is supple, no palpable cervical or supraclavicular lymphadenopathy. Heart: Regular in rate and rhythm with no murmurs, rubs, or gallops. Chest: Clear to auscultation bilaterally, with no rhonchi, wheezes, or rales. Abdomen: Soft, nontender, nondistended, with no rigidity or guarding. Extremities: No cyanosis or edema. Lymphatics: see Neck Exam Skin: No concerning lesions. Musculoskeletal: symmetric strength and muscle tone throughout. Neurologic: Cranial nerves II through XII are grossly intact. No obvious focalities. Speech is fluent. Coordination is intact. Psychiatric: Judgment and insight are intact. Affect is appropriate. Breasts: She has a large right UIQ lumpectomy scar and a right axillary scar which are healing well. No other palpable masses appreciated in the breasts or axillae.   ECOG = 0  0 - Asymptomatic (Fully active, able to carry on all predisease activities without restriction)  1 - Symptomatic but completely ambulatory (Restricted in physically strenuous activity but ambulatory and able to carry out work of a light or sedentary nature. For example, light housework, office work)  2 - Symptomatic, <50% in bed during the day (Ambulatory and capable of all self care but unable to carry out any work activities. Up and about more than 50% of waking hours)  3 - Symptomatic, >50% in bed, but not bedbound (Capable of only limited self-care, confined to bed or chair 50% or more of waking hours)  4 - Bedbound (Completely disabled. Cannot carry on any self-care. Totally confined to bed or chair)  5 - Death   Eustace Pen MM, Creech RH, Tormey DC, et al. [phone]). "Toxicity and response criteria of the [facility]". [place] Oncol. 5 (6): 649-55   LABORATORY  DATA:  Lab Results  Component Value Date   WBC 5.1 06/10/2018   HGB 13.5 06/10/2018   HCT 41.0 06/10/2018   MCV 91.9 06/10/2018   PLT 180 06/10/2018   CMP     Component Value Date/Time   NA 145 06/10/2018 1030   K 4.4 06/10/2018 1030   CL 112 (H) 06/10/2018 1030   CO2 22 06/10/2018 1030   GLUCOSE 89 06/10/2018 1030   BUN 6 (L) 06/10/2018 1030   CREATININE 0.94 06/10/2018 1030   CREATININE 1.02 03/17/2012 1712   CALCIUM 9.3 06/10/2018 1030   PROT 6.7 06/10/2018 1030   ALBUMIN 3.4 (L) 06/10/2018 1030   AST 126 (H) 06/10/2018 1030   ALT 98 (H) 06/10/2018 1030   ALKPHOS 150 (H) 06/10/2018 1030   BILITOT 0.6 06/10/2018 1030   GFRNONAA >60 06/10/2018 1030   GFRAA >60 06/10/2018 1030         RADIOGRAPHY: Nm Sentinel Node Inj-no Rpt (breast)  Result Date: 06/12/2018 Sulfur colloid was injected by the nuclear medicine technologist for melanoma sentinel node.   Mm Breast Surgical Specimen  Result Date: 06/12/2018 CLINICAL DATA:  Evaluate surgical specimen following RIGHT lumpectomy for breast cancer. EXAM: SPECIMEN RADIOGRAPH OF THE RIGHT BREAST COMPARISON:  Previous exam(s). FINDINGS: Status post excision of the RIGHT breast. The radioactive seed and biopsy marker clip are present, completely intact, and were marked for pathology. IMPRESSION: Specimen radiograph of the RIGHT breast. Electronically Signed  By: Margarette Canada M.D.   On: 06/12/2018 10:50   [place] Axilla Right  Result Date: 06/01/2018 CLINICAL DATA:  68 year old patient recently diagnosed with right breast cancer following stereotactic biopsy of a 7 mm mass in the posterior third of the upper inner quadrant. EXAM: ULTRASOUND OF THE RIGHT AXILLA COMPARISON:  DECEMBER 2019 AND JANUARY 2020 FINDINGS: Targeted ultrasound is performed, showing normal right axillary lymph nodes. No enlarged lymph nodes or cortical thickening is identified. IMPRESSION: Normal sonographic appearance of right axillary lymph nodes. Negative for right  axillary lymphadenopathy. RECOMMENDATION: Treatment planning for recently diagnosed right breast cancer. I have discussed the findings and recommendations with the patient. Results were also provided in writing at the conclusion of the visit. If applicable, a reminder letter will be sent to the patient regarding the next appointment. BI-RADS CATEGORY  1: Negative. Electronically Signed   By: Curlene Dolphin M.D.   On: 06/01/2018 14:08   Mm Rt Radioactive Seed Loc Mammo Guide  Result Date: 06/11/2018 CLINICAL DATA:  68 year old female with recently diagnosed invasive ductal carcinoma of the right breast presents for radioactive seed localization. EXAM: MAMMOGRAPHIC GUIDED RADIOACTIVE SEED LOCALIZATION OF THE RIGHT BREAST COMPARISON:  Previous exam(s). FINDINGS: Patient presents for radioactive seed localization prior to right breast lumpectomy. I met with the patient and we discussed the procedure of seed localization including benefits and alternatives. We discussed the high likelihood of a successful procedure. We discussed the risks of the procedure including infection, bleeding, tissue injury and further surgery. We discussed the low dose of radioactivity involved in the procedure. Informed, written consent was given. The usual time-out protocol was performed immediately prior to the procedure. The initial mammogram image today demonstrated a persistent hematoma surrounding the biopsy marker clip measuring approximately 3.5 cm. This was discussed with Dr. Dalbert Mccoy at 2 p.m. 06/11/2018. Given the biopsy proven malignancy and biopsy marking clips are felt to both reside within the hematoma, it was agreed to proceed with radioactive seed placement. Using mammographic guidance, sterile technique, 1% lidocaine and an I-125 radioactive seed, the biopsy marking clip at site of biopsied malignancy was localized using a medial to lateral approach. The follow-up mammogram images confirm the seed in the expected location and  were marked for Dr. Dalbert Mccoy. Follow-up survey of the patient confirms presence of the radioactive seed. Order number of I-125 seed:  [id number]. Total activity:  1.884 millicuries reference Date: 05/01/2018 The patient tolerated the procedure well and was released from the [facility]. She was given instructions regarding seed removal. IMPRESSION: Radioactive seed localization right breast. No apparent complications. Electronically Signed   By: Everlean Alstrom M.D.   On: 06/11/2018 14:56      IMPRESSION/PLAN: Right Breast Cancer   It was a pleasure meeting the patient today. We discussed the risks, benefits, and side effects of radiotherapy. I recommend radiotherapy to the right breast to reduce her risk of locoregional recurrence by 2/3.  We discussed that radiation would take approximately 16 treatments over 3.5 weeks to complete and that I would give the patient a few weeks to heal following surgery before starting treatment planning. We spoke about acute effects including skin irritation and fatigue as well as much less common late effects including internal organ injury or irritation. We spoke about the latest technology that is used to minimize the risk of late effects for patients undergoing radiotherapy to the breast or chest wall. No guarantees of treatment were given. The patient is enthusiastic about proceeding with treatment. I  look forward to participating in the patient's care.  She signed consent today. We will schedule CT simulation for early March.   __________________________________________   Eppie Gibson, MD   This document serves as a record of services personally performed by Eppie Gibson, MD. It was created on her behalf by Wilburn Mylar, a trained medical scribe. The creation of this record is based on the scribe's personal observations and the provider's statements to them. This document has been checked and approved by the attending provider.

## 2018-06-30 NOTE — Telephone Encounter (Signed)
Left voicemail for patient with my contact information to discuss the transportation program.

## 2018-07-01 ENCOUNTER — Encounter: Payer: Self-pay | Admitting: General Practice

## 2018-07-01 NOTE — Progress Notes (Signed)
[facility] Psychosocial Distress Screening Clinical Social Work  Clinical Social Work was referred by distress screening protocol.  The patient scored a 10 on the Psychosocial Distress Thermometer which indicates severe distress. Clinical Social Worker contacted patient by phone to assess for distress and other psychosocial needs. No answer, left VM w information about [place], contact information and encouragement to call for support/resources.  Also referred to [facility] transportation coordinator for help w transport issues.    [facility] DISTRESS SCREENING 06/30/2018  Screening Type Initial Screening  Distress experienced in past week (1-10) 10  Practical problem type Work/school;Transportation  Emotional problem type Nervousness/Anxiety;Adjusting to illness    Clinical Social Worker follow up needed: No.  If yes, follow up plan:  Beverely Pace, [place], LCSW Clinical Social Worker Phone:  [phone]

## 2018-07-10 ENCOUNTER — Ambulatory Visit
Admission: RE | Admit: 2018-07-10 | Discharge: 2018-07-10 | Disposition: A | Discharge status: Home / Self Care | Payer: BC Managed Care – PPO | Source: Ambulatory Visit | Attending: Radiation Oncology | Admitting: Radiation Oncology

## 2018-07-10 ENCOUNTER — Encounter: Payer: Self-pay | Admitting: Radiation Oncology

## 2018-07-10 DIAGNOSIS — Z510 Encounter for antineoplastic radiation therapy: Secondary | ICD-10-CM | POA: Diagnosis present

## 2018-07-10 DIAGNOSIS — Z170 Estrogen receptor positive status [ER+]: Secondary | ICD-10-CM

## 2018-07-10 DIAGNOSIS — C50211 Malignant neoplasm of upper-inner quadrant of right female breast: Secondary | ICD-10-CM | POA: Insufficient documentation

## 2018-07-10 NOTE — Progress Notes (Signed)
  Radiation Oncology         [phone]) [phone] ________________________________  Name: Caitlyn Mccoy MRN: [id number]  Date: 07/10/2018  DOB: [birth year]  SIMULATION AND TREATMENT PLANNING NOTE    Outpatient  DIAGNOSIS:     ICD-10-CM   1. Malignant neoplasm of upper-inner quadrant of right breast in female, estrogen receptor positive ([place]) C50.211    Z17.0     NARRATIVE:  The patient was brought to the [facility].  Identity was confirmed.  All relevant records and images related to the planned course of therapy were reviewed.  The patient freely provided informed written consent to proceed with treatment after reviewing the details related to the planned course of therapy. The consent form was witnessed and verified by the simulation staff.    Then, the patient was set-up in a stable reproducible supine position for radiation therapy with her ipsilateral arm over her head, and her upper body secured in a custom-made Vac-lok device.  CT images were obtained.  Surface markings were placed.  The CT images were loaded into the planning software.    TREATMENT PLANNING NOTE: Treatment planning then occurred.  The radiation prescription was entered and confirmed.     A total of 3 medically necessary complex treatment devices were fabricated and supervised by me: 2 fields with MLCs for custom blocks to protect heart, and lungs;  and, a Vac-lok. MORE COMPLEX DEVICES MAY BE MADE IN DOSIMETRY FOR FIELD IN FIELD BEAMS FOR DOSE HOMOGENEITY.  I have requested : 3D Simulation which is medically necessary to give adequate dose to at risk tissues while sparing lungs and heart.  I have requested a DVH of the following structures: lungs, heart, right lumpectomy cavity.    The patient will receive 42.56 Gy in 16 fractions to the right breast with 2 tangential fields.  This will not be followed by a boost.  Optical Surface Tracking Plan:  Since intensity modulated radiotherapy (IMRT) and 3D conformal  radiation treatment methods are predicated on accurate and precise positioning for treatment, intrafraction motion monitoring is medically necessary to ensure accurate and safe treatment delivery. The ability to quantify intrafraction motion without excessive ionizing radiation dose can only be performed with optical surface tracking. Accordingly, surface imaging offers the opportunity to obtain 3D measurements of patient position throughout IMRT and 3D treatments without excessive radiation exposure. I am ordering optical surface tracking for this patient's upcoming course of radiotherapy.  ________________________________   Reference:  Ursula Alert, J, et al. Surface imaging-based analysis of intrafraction motion for breast radiotherapy patients.Journal of [place], n. 6, nov. 2014. ISSN 20100712.  Available at: <http://www.jacmp.org/index.php/jacmp/article/view/4957>.    -----------------------------------  Eppie Gibson, MD

## 2018-07-15 ENCOUNTER — Telehealth: Payer: Self-pay | Admitting: Hematology and Oncology

## 2018-07-15 DIAGNOSIS — Z510 Encounter for antineoplastic radiation therapy: Secondary | ICD-10-CM | POA: Diagnosis not present

## 2018-07-15 NOTE — Telephone Encounter (Signed)
Scheduled appt per 3/10 sch message - pt aware of apt date and time

## 2018-07-20 ENCOUNTER — Ambulatory Visit
Admission: RE | Admit: 2018-07-20 | Discharge: 2018-07-20 | Disposition: A | Discharge status: Home / Self Care | Payer: BC Managed Care – PPO | Source: Ambulatory Visit | Attending: Radiation Oncology | Admitting: Radiation Oncology

## 2018-07-20 ENCOUNTER — Other Ambulatory Visit: Payer: Self-pay

## 2018-07-20 DIAGNOSIS — Z170 Estrogen receptor positive status [ER+]: Principal | ICD-10-CM

## 2018-07-20 DIAGNOSIS — C50211 Malignant neoplasm of upper-inner quadrant of right female breast: Secondary | ICD-10-CM

## 2018-07-20 DIAGNOSIS — Z510 Encounter for antineoplastic radiation therapy: Secondary | ICD-10-CM | POA: Diagnosis not present

## 2018-07-20 MED ORDER — RADIAPLEXRX EX GEL
Freq: Once | CUTANEOUS | Status: AC
  Administered 2018-07-20: 18:00:00 via TOPICAL

## 2018-07-20 MED ORDER — ALRA NON-METALLIC DEODORANT (RAD-ONC)
1.0000 "application " | Freq: Once | TOPICAL | Status: AC
  Administered 2018-07-20: 1 via TOPICAL

## 2018-07-20 NOTE — Progress Notes (Signed)

## 2018-07-21 ENCOUNTER — Other Ambulatory Visit: Payer: Self-pay

## 2018-07-21 ENCOUNTER — Ambulatory Visit
Admission: RE | Admit: 2018-07-21 | Discharge: 2018-07-21 | Disposition: A | Discharge status: Home / Self Care | Payer: BC Managed Care – PPO | Source: Ambulatory Visit | Attending: Radiation Oncology | Admitting: Radiation Oncology

## 2018-07-21 DIAGNOSIS — Z510 Encounter for antineoplastic radiation therapy: Secondary | ICD-10-CM | POA: Diagnosis not present

## 2018-07-22 ENCOUNTER — Other Ambulatory Visit: Payer: Self-pay

## 2018-07-22 ENCOUNTER — Ambulatory Visit
Admission: RE | Admit: 2018-07-22 | Discharge: 2018-07-22 | Disposition: A | Discharge status: Home / Self Care | Payer: BC Managed Care – PPO | Source: Ambulatory Visit | Attending: Radiation Oncology | Admitting: Radiation Oncology

## 2018-07-22 DIAGNOSIS — Z510 Encounter for antineoplastic radiation therapy: Secondary | ICD-10-CM | POA: Diagnosis not present

## 2018-07-23 ENCOUNTER — Ambulatory Visit
Admission: RE | Admit: 2018-07-23 | Discharge: 2018-07-23 | Disposition: A | Discharge status: Home / Self Care | Payer: BC Managed Care – PPO | Source: Ambulatory Visit | Attending: Radiation Oncology | Admitting: Radiation Oncology

## 2018-07-23 ENCOUNTER — Other Ambulatory Visit: Payer: Self-pay

## 2018-07-23 DIAGNOSIS — Z510 Encounter for antineoplastic radiation therapy: Secondary | ICD-10-CM | POA: Diagnosis not present

## 2018-07-24 ENCOUNTER — Other Ambulatory Visit: Payer: Self-pay

## 2018-07-24 ENCOUNTER — Ambulatory Visit
Admission: RE | Admit: 2018-07-24 | Discharge: 2018-07-24 | Disposition: A | Discharge status: Home / Self Care | Payer: BC Managed Care – PPO | Source: Ambulatory Visit | Attending: Radiation Oncology | Admitting: Radiation Oncology

## 2018-07-24 DIAGNOSIS — Z510 Encounter for antineoplastic radiation therapy: Secondary | ICD-10-CM | POA: Diagnosis not present

## 2018-07-27 ENCOUNTER — Other Ambulatory Visit: Payer: Self-pay

## 2018-07-27 ENCOUNTER — Ambulatory Visit
Admission: RE | Admit: 2018-07-27 | Discharge: 2018-07-27 | Disposition: A | Discharge status: Home / Self Care | Payer: BC Managed Care – PPO | Source: Ambulatory Visit | Attending: Radiation Oncology | Admitting: Radiation Oncology

## 2018-07-27 DIAGNOSIS — Z510 Encounter for antineoplastic radiation therapy: Secondary | ICD-10-CM | POA: Diagnosis not present

## 2018-07-28 ENCOUNTER — Ambulatory Visit
Admission: RE | Admit: 2018-07-28 | Discharge: 2018-07-28 | Disposition: A | Discharge status: Home / Self Care | Payer: BC Managed Care – PPO | Source: Ambulatory Visit | Attending: Radiation Oncology | Admitting: Radiation Oncology

## 2018-07-28 ENCOUNTER — Other Ambulatory Visit: Payer: Self-pay

## 2018-07-28 DIAGNOSIS — Z510 Encounter for antineoplastic radiation therapy: Secondary | ICD-10-CM | POA: Diagnosis not present

## 2018-07-29 ENCOUNTER — Ambulatory Visit
Admission: RE | Admit: 2018-07-29 | Discharge: 2018-07-29 | Disposition: A | Discharge status: Home / Self Care | Payer: BC Managed Care – PPO | Source: Ambulatory Visit | Attending: Radiation Oncology | Admitting: Radiation Oncology

## 2018-07-29 ENCOUNTER — Other Ambulatory Visit: Payer: Self-pay

## 2018-07-29 DIAGNOSIS — Z510 Encounter for antineoplastic radiation therapy: Secondary | ICD-10-CM | POA: Diagnosis not present

## 2018-07-30 ENCOUNTER — Other Ambulatory Visit: Payer: Self-pay

## 2018-07-30 ENCOUNTER — Ambulatory Visit
Admission: RE | Admit: 2018-07-30 | Discharge: 2018-07-30 | Disposition: A | Discharge status: Home / Self Care | Payer: BC Managed Care – PPO | Source: Ambulatory Visit | Attending: Radiation Oncology | Admitting: Radiation Oncology

## 2018-07-30 DIAGNOSIS — Z510 Encounter for antineoplastic radiation therapy: Secondary | ICD-10-CM | POA: Diagnosis not present

## 2018-07-31 ENCOUNTER — Ambulatory Visit
Admission: RE | Admit: 2018-07-31 | Discharge: 2018-07-31 | Disposition: A | Discharge status: Home / Self Care | Payer: BC Managed Care – PPO | Source: Ambulatory Visit | Attending: Radiation Oncology | Admitting: Radiation Oncology

## 2018-07-31 ENCOUNTER — Other Ambulatory Visit: Payer: Self-pay

## 2018-07-31 DIAGNOSIS — Z510 Encounter for antineoplastic radiation therapy: Secondary | ICD-10-CM | POA: Diagnosis not present

## 2018-08-03 ENCOUNTER — Other Ambulatory Visit: Payer: Self-pay

## 2018-08-03 ENCOUNTER — Ambulatory Visit
Admission: RE | Admit: 2018-08-03 | Discharge: 2018-08-03 | Disposition: A | Discharge status: Home / Self Care | Payer: BC Managed Care – PPO | Source: Ambulatory Visit | Attending: Radiation Oncology | Admitting: Radiation Oncology

## 2018-08-03 DIAGNOSIS — Z510 Encounter for antineoplastic radiation therapy: Secondary | ICD-10-CM | POA: Diagnosis not present

## 2018-08-03 NOTE — Progress Notes (Signed)
Patient Care Team: Patient, No Pcp Per as PCP - General (General Practice)  DIAGNOSIS:    ICD-10-CM   1. Malignant neoplasm of upper-inner quadrant of right breast in female, estrogen receptor positive ([place]) C50.211    Z17.0     SUMMARY OF ONCOLOGIC HISTORY:   Malignant neoplasm of upper-inner quadrant of right breast in female, estrogen receptor positive ([place])   04/27/2018 Initial Diagnosis    Screening mammogram detected right breast masses upper inner quadrant 7 mm and 4 mm, biopsy revealed grade 1 IDC ER 100%, PR 100%, HER-2 negative IHC 0, Ki-67 12%, T1BN0 stage Ia clinical stage    06/12/2018 Surgery    Right lumpectomy: 0.3 cm IDC with extracellular mucin, grade 1, with intermediate grade DCIS, margins negative, negative for lymphovascular or perineural invasion, 0/1 lymph node negative, ER 100%, PR 100%, HER-2 negative IHC 0, Ki-67 12%, T1 a N0 stage Ia    06/30/2018 Cancer Staging    Staging form: Breast, AJCC 8th Edition - Pathologic: Stage IA (pT1a, pN0, cM0, G1, ER+, PR+, HER2-) - Signed by Eppie Gibson, MD on 06/30/2018    07/21/2018 -  Radiation Therapy    Adjuvant radiation     CHIEF COMPLIANT: Follow-up after radiation to discuss further treatment  INTERVAL HISTORY: Caitlyn Mccoy is a 68 y.o. with above-mentioned history of right breast cancer treated with lumpectomy who is currently receiving adjuvant radiation therapy. She presents to the clinic alone today to discuss further treatment.  She is tolerating radiation extremely well except for mild radiation dermatitis.  REVIEW OF SYSTEMS:   Constitutional: Denies fevers, chills or abnormal weight loss Eyes: Denies blurriness of vision Ears, nose, mouth, throat, and face: Denies mucositis or sore throat Respiratory: Denies cough, dyspnea or wheezes Cardiovascular: Denies palpitation, chest discomfort Gastrointestinal: Denies nausea, heartburn or change in bowel habits Skin: Denies abnormal skin rashes  Lymphatics: Denies new lymphadenopathy or easy bruising Neurological: Denies numbness, tingling or new weaknesses Behavioral/Psych: Mood is stable, no new changes  Extremities: No lower extremity edema Breast: Radiation dermatitis All other systems were reviewed with the patient and are negative.  I have reviewed the past medical history, past surgical history, social history and family history with the patient and they are unchanged from previous note.  ALLERGIES:  is allergic to penicillins and sulfa antibiotics.  MEDICATIONS:  Current Outpatient Medications  Medication Sig Dispense Refill  . acetaminophen (TYLENOL) 325 MG tablet Take 650 mg by mouth every 6 (six) hours as needed.    . fluticasone (FLONASE) 50 MCG/ACT nasal spray     . HYDROcodone-acetaminophen (NORCO/VICODIN) 5-325 MG tablet hydrocodone 5 mg-acetaminophen 325 mg tablet     No current facility-administered medications for this visit.     PHYSICAL EXAMINATION: ECOG PERFORMANCE STATUS: 1 - Symptomatic but completely ambulatory  There were no vitals filed for this visit. There were no vitals filed for this visit.  GENERAL: alert, no distress and comfortable SKIN: skin color, texture, turgor are normal, no rashes or significant lesions EYES: normal, Conjunctiva are pink and non-injected, sclera clear OROPHARYNX: no exudate, no erythema and lips, buccal mucosa, and tongue normal  NECK: supple, thyroid normal size, non-tender, without nodularity LYMPH: no palpable lymphadenopathy in the cervical, axillary or inguinal LUNGS: clear to auscultation and percussion with normal breathing effort HEART: regular rate & rhythm and no murmurs and no lower extremity edema ABDOMEN: abdomen soft, non-tender and normal bowel sounds MUSCULOSKELETAL: no cyanosis of digits and no clubbing  NEURO: alert &  oriented x 3 with fluent speech, no focal motor/sensory deficits EXTREMITIES: No lower extremity edema  LABORATORY DATA:  I  have reviewed the data as listed CMP Latest Ref Rng & Units 06/10/2018 03/17/2012  Glucose 70 - 99 mg/dL 89 69(L)  BUN 8 - 23 mg/dL 6(L) 16  Creatinine 0.44 - 1.00 mg/dL 0.94 1.02  Sodium 135 - 145 mmol/L 145 140  Potassium 3.5 - 5.1 mmol/L 4.4 4.0  Chloride 98 - 111 mmol/L 112(H) 106  CO2 22 - 32 mmol/L 22 22  Calcium 8.9 - 10.3 mg/dL 9.3 9.7  Total Protein 6.5 - 8.1 g/dL 6.7 6.8  Total Bilirubin 0.3 - 1.2 mg/dL 0.6 1.1  Alkaline Phos 38 - 126 U/L 150(H) 116  AST 15 - 41 U/L 126(H) 28  ALT 0 - 44 U/L 98(H) 35    Lab Results  Component Value Date   WBC 5.1 06/10/2018   HGB 13.5 06/10/2018   HCT 41.0 06/10/2018   MCV 91.9 06/10/2018   PLT 180 06/10/2018   NEUTROABS 3.2 06/10/2018    ASSESSMENT & PLAN:  Malignant neoplasm of upper-inner quadrant of right breast in female, estrogen receptor positive ([place]) 06/12/2018:Right lumpectomy: 0.3 cm IDC with extracellular mucin, grade 1, with intermediate grade DCIS, margins negative, negative for lymphovascular or perineural invasion, 0/1 lymph node negative, ER 100%, PR 100%, HER-2 negative IHC 0, Ki-67 12%, T1 a N0 stage Ia  Treatment plan: 1.  Adjuvant radiation therapy 07/21/2018- 2.  Follow-up adjuvant antiestrogen therapy  Anastrozole counseling: We discussed the risks and benefits of anti-estrogen therapy with aromatase inhibitors. These include but not limited to insomnia, hot flashes, mood changes, vaginal dryness, bone density loss, and weight gain. We strongly believe that the benefits far outweigh the risks. Patient understands these risks and consented to starting treatment. Planned treatment duration is 5-7 years.  Return to clinic in 6 months for survivorship care plan visit       No orders of the defined types were placed in this encounter.  The patient has a good understanding of the overall plan. she agrees with it. she will call with any problems that may develop before the next visit here.  Nicholas Lose, MD  08/04/2018  Julious Oka Dorshimer am acting as scribe for Dr. Nicholas Lose.  I have reviewed the above documentation for accuracy and completeness, and I agree with the above.

## 2018-08-04 ENCOUNTER — Ambulatory Visit
Admission: RE | Admit: 2018-08-04 | Discharge: 2018-08-04 | Disposition: A | Discharge status: Home / Self Care | Payer: BC Managed Care – PPO | Source: Ambulatory Visit | Attending: Radiation Oncology | Admitting: Radiation Oncology

## 2018-08-04 ENCOUNTER — Inpatient Hospital Stay (HOSPITAL_BASED_OUTPATIENT_CLINIC_OR_DEPARTMENT_OTHER): Payer: BC Managed Care – PPO | Admitting: Hematology and Oncology

## 2018-08-04 ENCOUNTER — Other Ambulatory Visit: Payer: Self-pay

## 2018-08-04 DIAGNOSIS — C50211 Malignant neoplasm of upper-inner quadrant of right female breast: Secondary | ICD-10-CM

## 2018-08-04 DIAGNOSIS — Z170 Estrogen receptor positive status [ER+]: Secondary | ICD-10-CM | POA: Insufficient documentation

## 2018-08-04 DIAGNOSIS — Z510 Encounter for antineoplastic radiation therapy: Secondary | ICD-10-CM | POA: Diagnosis not present

## 2018-08-04 DIAGNOSIS — L598 Other specified disorders of the skin and subcutaneous tissue related to radiation: Secondary | ICD-10-CM

## 2018-08-04 DIAGNOSIS — Z923 Personal history of irradiation: Secondary | ICD-10-CM | POA: Insufficient documentation

## 2018-08-04 DIAGNOSIS — Z79811 Long term (current) use of aromatase inhibitors: Secondary | ICD-10-CM

## 2018-08-04 MED ORDER — ANASTROZOLE 1 MG PO TABS: 1.0000 mg | ORAL_TABLET | Freq: Every day | ORAL | 3 refills | Status: DC

## 2018-08-04 NOTE — Assessment & Plan Note (Signed)
06/12/2018:Right lumpectomy: 0.3 cm IDC with extracellular mucin, grade 1, with intermediate grade DCIS, margins negative, negative for lymphovascular or perineural invasion, 0/1 lymph node negative, ER 100%, PR 100%, HER-2 negative IHC 0, Ki-67 12%, T1 a N0 stage Ia  Treatment plan: 1.  Adjuvant radiation therapy 07/21/2018- 2.  Follow-up adjuvant antiestrogen therapy  Anastrozole counseling: We discussed the risks and benefits of anti-estrogen therapy with aromatase inhibitors. These include but not limited to insomnia, hot flashes, mood changes, vaginal dryness, bone density loss, and weight gain. We strongly believe that the benefits far outweigh the risks. Patient understands these risks and consented to starting treatment. Planned treatment duration is 5-7 years.  Return to clinic in 6 months for survivorship care plan visit

## 2018-08-05 ENCOUNTER — Ambulatory Visit
Admission: RE | Admit: 2018-08-05 | Discharge: 2018-08-05 | Disposition: A | Discharge status: Home / Self Care | Payer: BC Managed Care – PPO | Source: Ambulatory Visit | Attending: Radiation Oncology | Admitting: Radiation Oncology

## 2018-08-05 ENCOUNTER — Other Ambulatory Visit: Payer: Self-pay

## 2018-08-05 DIAGNOSIS — Z170 Estrogen receptor positive status [ER+]: Secondary | ICD-10-CM | POA: Diagnosis not present

## 2018-08-05 DIAGNOSIS — C50211 Malignant neoplasm of upper-inner quadrant of right female breast: Secondary | ICD-10-CM | POA: Insufficient documentation

## 2018-08-05 DIAGNOSIS — Z510 Encounter for antineoplastic radiation therapy: Secondary | ICD-10-CM | POA: Diagnosis present

## 2018-08-06 ENCOUNTER — Other Ambulatory Visit: Payer: Self-pay

## 2018-08-06 ENCOUNTER — Ambulatory Visit
Admission: RE | Admit: 2018-08-06 | Discharge: 2018-08-06 | Disposition: A | Discharge status: Home / Self Care | Payer: BC Managed Care – PPO | Source: Ambulatory Visit | Attending: Radiation Oncology | Admitting: Radiation Oncology

## 2018-08-06 ENCOUNTER — Telehealth: Payer: Self-pay | Admitting: Hematology and Oncology

## 2018-08-06 DIAGNOSIS — Z510 Encounter for antineoplastic radiation therapy: Secondary | ICD-10-CM | POA: Diagnosis not present

## 2018-08-06 NOTE — Telephone Encounter (Signed)
Tried to reach regarding 10/2

## 2018-08-07 ENCOUNTER — Ambulatory Visit
Admission: RE | Admit: 2018-08-07 | Discharge: 2018-08-07 | Disposition: A | Discharge status: Home / Self Care | Payer: BC Managed Care – PPO | Source: Ambulatory Visit | Attending: Radiation Oncology | Admitting: Radiation Oncology

## 2018-08-07 ENCOUNTER — Other Ambulatory Visit: Payer: Self-pay

## 2018-08-07 DIAGNOSIS — Z510 Encounter for antineoplastic radiation therapy: Secondary | ICD-10-CM | POA: Diagnosis not present

## 2018-08-10 ENCOUNTER — Encounter: Payer: Self-pay | Admitting: Radiation Oncology

## 2018-08-10 ENCOUNTER — Other Ambulatory Visit: Payer: Self-pay | Admitting: Radiation Oncology

## 2018-08-10 ENCOUNTER — Other Ambulatory Visit: Payer: Self-pay

## 2018-08-10 ENCOUNTER — Encounter: Payer: Self-pay | Admitting: *Deleted

## 2018-08-10 ENCOUNTER — Ambulatory Visit
Admission: RE | Admit: 2018-08-10 | Discharge: 2018-08-10 | Disposition: A | Discharge status: Home / Self Care | Payer: BC Managed Care – PPO | Source: Ambulatory Visit | Attending: Radiation Oncology | Admitting: Radiation Oncology

## 2018-08-10 DIAGNOSIS — Z510 Encounter for antineoplastic radiation therapy: Secondary | ICD-10-CM | POA: Diagnosis not present

## 2018-08-10 DIAGNOSIS — Z170 Estrogen receptor positive status [ER+]: Principal | ICD-10-CM

## 2018-08-10 DIAGNOSIS — C50211 Malignant neoplasm of upper-inner quadrant of right female breast: Secondary | ICD-10-CM

## 2018-08-10 MED ORDER — RADIAPLEXRX EX GEL
Freq: Once | CUTANEOUS | Status: AC
  Administered 2018-08-10: 17:00:00 via TOPICAL

## 2018-08-10 MED ORDER — ALRA NON-METALLIC DEODORANT (RAD-ONC)
1.0000 "application " | Freq: Once | TOPICAL | Status: AC
  Administered 2018-08-10: 1 via TOPICAL

## 2018-09-08 NOTE — Progress Notes (Signed)
I called the patient today about her upcoming follow-up appointment in radiation oncology.   Given concerns about the COVID-19 pandemic, I offered a phone assessment with the patient to determine if coming to the clinic was necessary. She accepted.  I let the patient know that I had spoken with Dr. Isidore Moos, and she wanted them to know the importance of washing their hands for at least 20 seconds at a time, especially after going out in public, and before they eat.  Limit going out in public whenever possible. Do not touch your face, unless your hands are clean, such as when bathing. Get plenty of rest, eat well, and stay hydrated.   The patient denies any symptomatic concerns.  Specifically, they report good healing of their skin in the radiation fields.  Skin is intact.    I recommended that she continue skin care by applying oil or lotion with vitamin E to the skin in the radiation fields, BID, for 2 more months.  Continue follow-up with medical oncology - follow-up is scheduled on 02/05/19 with Wilber Bihari NP in survivorship.  I explained that yearly mammograms are important for patients with intact breast tissue, and physical exams are important after mastectomy for patients that cannot undergo mammography.  I encouraged her to call if she had further questions or concerns about her healing. Otherwise, she will follow-up PRN in radiation oncology. Patient is pleased with this plan, and we will cancel her upcoming follow-up to reduce the risk of COVID-19 transmission.

## 2018-09-09 ENCOUNTER — Ambulatory Visit
Admission: RE | Admit: 2018-09-09 | Discharge: 2018-09-09 | Disposition: A | Discharge status: Home / Self Care | Payer: BC Managed Care – PPO | Source: Ambulatory Visit | Attending: Radiation Oncology | Admitting: Radiation Oncology

## 2018-09-15 NOTE — Progress Notes (Signed)
  Patient Name: Caitlyn Mccoy MRN: [id number] DOB: [birth year] Referring Physician: Fanny Skates (Profile Not Attached) Date of Service: 08/10/2018 [facility] [place]                                                        End Of Treatment Note  Diagnoses: C50.211-Malignant neoplasm of upper-inner quadrant of right female breast  Cancer Staging Malignant neoplasm of upper-inner quadrant of right breast in female, estrogen receptor positive ([place]) Staging form: Breast, AJCC 8th Edition - Pathologic: Stage IA (pT1a, pN0, cM0, G1, ER+, PR+, HER2-) - Signed by Eppie Gibson, MD on 06/30/2018  Intent: Curative  Radiation Treatment Dates: 07/20/2018 through 08/10/2018 Site Technique Total Dose Dose per Fx Completed Fx Beam Energies  Breast: Breast_Rt 3D 42.56/42.56 2.66 16/16 6X, 10X   Narrative: The patient tolerated radiation therapy relatively well. She experienced increasing fatigue and some expected skin irritation as she progressed through treatment. Hyperpigmentation and dryness was noted over her right breast, and her skin has remained intact. She is applying Sonafine to her skin. She also noted occasional sharp shooting pains in her breast.   Plan: The patient will follow-up with radiation oncology in one month.  ________________________________________________  Eppie Gibson, MD  This document serves as a record of services personally performed by Eppie Gibson, MD. It was created on her behalf by Rae Lips, a trained medical scribe. The creation of this record is based on the scribe's personal observations and the provider's statements to them. This document has been checked and approved by the attending provider.

## 2018-12-09 ENCOUNTER — Telehealth: Payer: Self-pay

## 2018-12-09 NOTE — Telephone Encounter (Signed)
RN returned call, voicemail left for return call.  

## 2018-12-10 ENCOUNTER — Telehealth: Payer: Self-pay

## 2018-12-10 NOTE — Telephone Encounter (Signed)
Tried to call patient to talk about the program but there is no answer. I did leave a VM with a call back number.

## 2019-01-15 ENCOUNTER — Telehealth: Payer: Self-pay

## 2019-01-15 ENCOUNTER — Telehealth: Payer: Self-pay | Admitting: Adult Health

## 2019-01-15 NOTE — Telephone Encounter (Signed)
RN returned call, voicemail left for patient to call back.  

## 2019-01-15 NOTE — Telephone Encounter (Signed)
I left a message regarding video visit  °

## 2019-02-05 ENCOUNTER — Telehealth: Payer: Self-pay

## 2019-02-05 ENCOUNTER — Encounter: Payer: Self-pay | Admitting: Adult Health

## 2019-02-05 ENCOUNTER — Other Ambulatory Visit: Payer: Self-pay

## 2019-02-05 ENCOUNTER — Inpatient Hospital Stay: Payer: BC Managed Care – PPO | Attending: Adult Health | Admitting: Adult Health

## 2019-02-05 ENCOUNTER — Inpatient Hospital Stay: Payer: BC Managed Care – PPO

## 2019-02-05 VITALS — BP 154/82 | HR 57 | Temp 98.3°F | Resp 17 | Ht 59.5 in | Wt 183.8 lb

## 2019-02-05 DIAGNOSIS — C50211 Malignant neoplasm of upper-inner quadrant of right female breast: Secondary | ICD-10-CM | POA: Insufficient documentation

## 2019-02-05 DIAGNOSIS — K449 Diaphragmatic hernia without obstruction or gangrene: Secondary | ICD-10-CM | POA: Insufficient documentation

## 2019-02-05 DIAGNOSIS — Z79811 Long term (current) use of aromatase inhibitors: Secondary | ICD-10-CM | POA: Diagnosis not present

## 2019-02-05 DIAGNOSIS — K219 Gastro-esophageal reflux disease without esophagitis: Secondary | ICD-10-CM | POA: Insufficient documentation

## 2019-02-05 DIAGNOSIS — Z8673 Personal history of transient ischemic attack (TIA), and cerebral infarction without residual deficits: Secondary | ICD-10-CM | POA: Insufficient documentation

## 2019-02-05 DIAGNOSIS — Z170 Estrogen receptor positive status [ER+]: Secondary | ICD-10-CM

## 2019-02-05 DIAGNOSIS — Z923 Personal history of irradiation: Secondary | ICD-10-CM | POA: Insufficient documentation

## 2019-02-05 DIAGNOSIS — I10 Essential (primary) hypertension: Secondary | ICD-10-CM | POA: Insufficient documentation

## 2019-02-05 DIAGNOSIS — Z87891 Personal history of nicotine dependence: Secondary | ICD-10-CM | POA: Diagnosis not present

## 2019-02-05 DIAGNOSIS — E559 Vitamin D deficiency, unspecified: Secondary | ICD-10-CM | POA: Diagnosis not present

## 2019-02-05 LAB — CMP (CANCER CENTER ONLY)
ALT: 35 U/L (ref 0–44)
AST: 33 U/L (ref 15–41)
Albumin: 3.6 g/dL (ref 3.5–5.0)
Alkaline Phosphatase: 174 U/L — ABNORMAL HIGH (ref 38–126)
Anion gap: 7 (ref 5–15)
BUN: 12 mg/dL (ref 8–23)
CO2: 28 mmol/L (ref 22–32)
Calcium: 9.7 mg/dL (ref 8.9–10.3)
Chloride: 109 mmol/L (ref 98–111)
Creatinine: 1.02 mg/dL — ABNORMAL HIGH (ref 0.44–1.00)
GFR, Est AFR Am: >60 mL/min (ref >60)
GFR, Estimated: 56 mL/min — ABNORMAL LOW (ref >60)
Glucose, Bld: 84 mg/dL (ref 70–99)
Potassium: 4 mmol/L (ref 3.5–5.1)
Sodium: 144 mmol/L (ref 135–145)
Total Bilirubin: 0.6 mg/dL (ref 0.3–1.2)
Total Protein: 7.2 g/dL (ref 6.5–8.1)

## 2019-02-05 LAB — VITAMIN D 25 HYDROXY (VIT D DEFICIENCY, FRACTURES): Vit D, 25-Hydroxy: 59.26 ng/mL (ref 30–100)

## 2019-02-05 NOTE — Telephone Encounter (Signed)
TC to pt per Mendel Ryder to let her know Vitamin d has recovered it is in a good range at 59. Patient verbalized understanding. No further problems or concerns at this time.

## 2019-02-05 NOTE — Telephone Encounter (Signed)
Per Mendel Ryder called Dr. Vito Berger [phone]) to get a copy of patients dexa. They will be faxing it to [phone]

## 2019-02-05 NOTE — Progress Notes (Signed)
CLINIC:  Survivorship   REASON FOR VISIT:  Routine follow-up post-treatment for a recent history of breast cancer.  BRIEF ONCOLOGIC HISTORY:  Oncology History  Malignant neoplasm of upper-inner quadrant of right breast in female, estrogen receptor positive ([place])  04/27/2018 Initial Diagnosis   Screening mammogram detected right breast masses upper inner quadrant 7 mm and 4 mm, biopsy revealed grade 1 IDC ER 100%, PR 100%, HER-2 negative IHC 0, Ki-67 12%, T1BN0 stage Ia clinical stage   06/12/2018 Surgery   Right lumpectomy: 0.3 cm IDC with extracellular mucin, grade 1, with intermediate grade DCIS, margins negative, negative for lymphovascular or perineural invasion, 0/1 lymph node negative, ER 100%, PR 100%, HER-2 negative IHC 0, Ki-67 12%, T1 a N0 stage Ia   06/30/2018 Cancer Staging   Staging form: Breast, AJCC 8th Edition - Pathologic: Stage IA (pT1a, pN0, cM0, G1, ER+, PR+, HER2-) - Signed by Eppie Gibson, MD on 06/30/2018   07/21/2018 -  Radiation Therapy   Adjuvant radiation   08/2018 -  Anti-estrogen oral therapy   Anastrozole daily     INTERVAL HISTORY:  Ms. Branscome presents to the [facility] today for our initial meeting to review her survivorship care plan detailing her treatment course for breast cancer, as well as monitoring long-term side effects of that treatment, education regarding health maintenance, screening, and overall wellness and health promotion.     Overall, Ms. Mcandrew reports feeling quite well.  She notes her legs and feet get tired from time to time and thinks shemmay have some circulation issues.  She is taking anastrozole daily and says she is tolerating it well.  She does have some achiness, but denies hot flashes or dryness.  She is feeling well overall.      REVIEW OF SYSTEMS:  Review of Systems  Constitutional: Negative for appetite change, chills, fatigue, fever and unexpected weight change.  HENT:   Negative for hearing loss, lump/mass,  sore throat and trouble swallowing.   Eyes: Negative for eye problems and icterus.  Respiratory: Negative for chest tightness, cough and shortness of breath.   Cardiovascular: Negative for chest pain, leg swelling and palpitations.  Gastrointestinal: Negative for abdominal distention, abdominal pain, constipation, diarrhea, nausea and vomiting.  Endocrine: Negative for hot flashes.  Genitourinary: Negative for difficulty urinating.   Musculoskeletal: Positive for arthralgias.  Skin: Negative for itching and rash.  Neurological: Negative for dizziness, extremity weakness, headaches and numbness.  Hematological: Negative for adenopathy. Does not bruise/bleed easily.  Psychiatric/Behavioral: Negative for depression. The patient is not nervous/anxious.   Breast: Denies any new nodularity, masses, tenderness, nipple changes, or nipple discharge.      ONCOLOGY TREATMENT TEAM:  1. Surgeon:  Dr. Dalbert Batman at [facility] 2. Medical Oncologist: Dr. Lindi Adie  3. Radiation Oncologist: Dr. Isidore Moos    PAST MEDICAL/SURGICAL HISTORY:  Past Medical History:  Diagnosis Date   Blindness    left eye since birth   Breast cancer of upper-inner quadrant of right female breast ([place]) 06/12/2018   GERD (gastroesophageal reflux disease)    Hiatal hernia    History of cervical dysplasia    in her 30s, underwent partial cervix removal and cryosurgery   Hypertension    Stroke [facility]) 2000   denies deficits   Yeast infection    Past Surgical History:  Procedure Laterality Date   BREAST LUMPECTOMY WITH RADIOACTIVE SEED AND SENTINEL LYMPH NODE BIOPSY Right 06/12/2018   Procedure: RIGHT BREAST LUMPECTOMY WITH RADIOACTIVE SEED AND  RIGHT AXILLARY  DEEP SENTINEL LYMPH NODE BIOPSY WITH BLUE DYE INJECTION ERAS PATHWAY;  Surgeon: Fanny Skates, MD;  Location: [place];  Service: General;  Laterality: Right;  PECTORAL BLOCK   CAROTID ENDARTERECTOMY  2001   TONSILLECTOMY     WISDOM  TOOTH EXTRACTION       ALLERGIES:  Allergies  Allergen Reactions   Penicillins    Sulfa Antibiotics      CURRENT MEDICATIONS:  Outpatient Encounter Medications as of 02/05/2019  Medication Sig   acetaminophen (TYLENOL) 325 MG tablet Take 650 mg by mouth every 6 (six) hours as needed.   anastrozole (ARIMIDEX) 1 MG tablet Take 1 tablet (1 mg total) by mouth daily.   fluticasone (FLONASE) 50 MCG/ACT nasal spray    HYDROcodone-acetaminophen (NORCO/VICODIN) 5-325 MG tablet hydrocodone 5 mg-acetaminophen 325 mg tablet   No facility-administered encounter medications on file as of 02/05/2019.      ONCOLOGIC FAMILY HISTORY:  Family History  Problem Relation Age of Onset   Breast cancer Neg Hx      GENETIC COUNSELING/TESTING: Not at this time  SOCIAL HISTORY:  Social History   Socioeconomic History   Marital status: Single    Spouse name: Not on file   Number of children: Not on file   Years of education: Not on file   Highest education level: Not on file  Occupational History   Not on file  Social Needs   Financial resource strain: Not on file   Food insecurity    Worry: Not on file    Inability: Not on file   Transportation needs    Medical: Yes    Non-medical: No  Tobacco Use   Smoking status: Former Smoker    Quit date: 06/30/1989    Years since quitting: 29.6   Smokeless tobacco: Never Used  Substance and Sexual Activity   Alcohol use: No   Drug use: No   Sexual activity: Not Currently  Lifestyle   Physical activity    Days per week: Not on file    Minutes per session: Not on file   Stress: Not on file  Relationships   Social connections    Talks on phone: Not on file    Gets together: Not on file    Attends religious service: Not on file    Active member of club or organization: Not on file    Attends meetings of clubs or organizations: Not on file    Relationship status: Not on file   Intimate partner violence    Fear of  current or ex partner: No    Emotionally abused: No    Physically abused: No    Forced sexual activity: No  Other Topics Concern   Not on file  Social History Narrative   Not on file    PHYSICAL EXAMINATION:  Vital Signs:   Vitals:   02/05/19 1027  BP: (!) 154/82  Pulse: (!) 57  Resp: 17  Temp: 98.3 F (36.8 C)  SpO2: 100%   Filed Weights   02/05/19 1027  Weight: 183 lb 12.8 oz (83.4 kg)   General: Well-nourished, well-appearing female in no acute distress.  She is unaccompanied today.   HEENT: Head is normocephalic.  Pupils equal and reactive to light. Conjunctivae clear without exudate.  Sclerae anicteric. Oral mucosa is pink, moist.  Oropharynx is pink without lesions or erythema.  Lymph: No cervical, supraclavicular, or infraclavicular lymphadenopathy noted on palpation.  Cardiovascular: Regular rate and rhythm.Marland Kitchen Respiratory: Clear to auscultation  bilaterally. Chest expansion symmetric; breathing non-labored.  GI: Abdomen soft and round; non-tender, non-distended. Bowel sounds normoactive.  GU: Deferred.  Neuro: No focal deficits. Steady gait.  Psych: Mood and affect normal and appropriate for situation.  Extremities: No edema. MSK: No focal spinal tenderness to palpation.  Full range of motion in bilateral upper extremities Skin: Warm and dry.  LABORATORY DATA:  None for this visit.  DIAGNOSTIC IMAGING:  None for this visit.      ASSESSMENT AND PLAN:  Ms.. Lague is a pleasant 68 y.o. female with Stage IA right breast invasive ductal carcinoma, ER+/PR+/HER2-, diagnosed in 04/2018, treated with lumpectomy, adjuvant radiation therapy, and anti-estrogen therapy with Anastrozole beginning in 08/2018.  She presents to the Survivorship Clinic for our initial meeting and routine follow-up post-completion of treatment for breast cancer.    1. Stage IA right/left breast cancer:  Ms. Birchler is continuing to recover from definitive treatment for breast cancer. She will  follow-up with her medical oncologist, Dr. Lindi Adie in 6 months with history and physical exam per surveillance protocol.  She will continue her anti-estrogen therapy with Anastrozole. Thus far, she is tolerating the Anastrozole well, with minimal side effects. She is due for mammogram in 03/2019; orders placed today. Today, a comprehensive survivorship care plan and treatment summary was reviewed with the patient today detailing her breast cancer diagnosis, treatment course, potential late/long-term effects of treatment, appropriate follow-up care with recommendations for the future, and patient education resources.  A copy of this summary, along with a letter will be sent to the patients primary care provider via mail/fax/In Basket message after todays visit.    2. Bone health:  Given Ms. Clopper's age/history of breast cancer and her current treatment regimen including anti-estrogen therapy with Anastrozole, she is at risk for bone demineralization.  She has DEXA done with [name].  My nurse called to request a copy of the results, and we are awaiting its arrival.  I counseled Ahja that depending on when it was done, depends on when she can have it again, as they are typically every 2 years.  She was given education on specific activities to promote bone health.  3. Cancer screening:  Due to Ms. Barretta's history and her age, she should receive screening for skin cancers, colon cancer, and gynecologic cancers.  The information and recommendations are listed on the patient's comprehensive care plan/treatment summary and were reviewed in detail with the patient.    4. Health maintenance and wellness promotion: Ms. Riehle was encouraged to consume 5-7 servings of fruits and vegetables per day. We reviewed the "Nutrition Rainbow" handout, as well as the handout "Take Control of Your Health and Reduce Your Cancer Risk" from the [place].  She was also encouraged to engage in moderate to  vigorous exercise for 30 minutes per day most days of the week. We discussed the [employer] fitness program, which is designed for cancer survivors to help them become more physically fit after cancer treatments.  She was instructed to limit her alcohol consumption and continue to abstain from tobacco use.     5. Support services/counseling: It is not uncommon for this period of the patient's cancer care trajectory to be one of many emotions and stressors.  We discussed an opportunity for her to participate in the next session of [facility] ("Finding Your New Normal") support group series designed for patients after they have completed treatment.   Ms. Hiley was encouraged to take advantage of our  many other support services programs, support groups, and/or counseling in coping with her new life as a cancer survivor after completing anti-cancer treatment.  She was offered support today through active listening and expressive supportive counseling.  She was given information regarding our available services and encouraged to contact me with any questions or for help enrolling in any of our support group/programs.    Dispo:   -Return to cancer center in 6 months for f/u with Dr. Lindi Adie  -Mammogram due in 03/2019 -Follow up with surgery 02/2019 -She is welcome to return back to the Survivorship Clinic at any time; no additional follow-up needed at this time.  -Consider referral back to survivorship as a long-term survivor for continued surveillance  A total of (30) minutes of face-to-face time was spent with this patient with greater than 50% of that time in counseling and care-coordination.   Gardenia Phlegm, NP Survivorship Program [place] [phone]   Note: PRIMARY CARE PROVIDER Patient, No Pcp Per None None

## 2019-02-09 ENCOUNTER — Telehealth: Payer: Self-pay

## 2019-02-09 NOTE — Telephone Encounter (Signed)
LVM for patient to call back.  Need to give BD results as normal and need to repeat 06/2019.

## 2019-02-11 NOTE — Telephone Encounter (Signed)
Patient returned call and left vm for nurse.  Nurse called back and unable to reach patient.  VM was left for patient that BD results are normal and per NP she should repeat BD 06/2019.  Left number to center for patient in case she has further questions.

## 2019-04-05 ENCOUNTER — Other Ambulatory Visit: Payer: Self-pay

## 2019-04-05 ENCOUNTER — Ambulatory Visit
Admission: RE | Admit: 2019-04-05 | Discharge: 2019-04-05 | Disposition: A | Discharge status: Home / Self Care | Payer: BC Managed Care – PPO | Source: Ambulatory Visit | Attending: Adult Health | Admitting: Adult Health

## 2019-04-05 DIAGNOSIS — C50211 Malignant neoplasm of upper-inner quadrant of right female breast: Secondary | ICD-10-CM

## 2019-04-05 DIAGNOSIS — Z170 Estrogen receptor positive status [ER+]: Secondary | ICD-10-CM

## 2019-04-13 ENCOUNTER — Encounter: Payer: Self-pay | Admitting: *Deleted

## 2019-08-09 NOTE — Progress Notes (Signed)
Patient Care Team: Patient, No Pcp Per as PCP - General (General Practice) Nicholas Lose, MD as Consulting Physician (Hematology and Oncology) Eppie Gibson, MD as Attending Physician (Radiation Oncology) Fanny Skates, MD as Consulting Physician (General Surgery) Crawford Givens, MD as Consulting Physician (Obstetrics and Gynecology)  DIAGNOSIS:    ICD-10-CM   1. Malignant neoplasm of upper-inner quadrant of right breast in female, estrogen receptor positive ([place])  C50.211    Z17.0     SUMMARY OF ONCOLOGIC HISTORY: Oncology History  Malignant neoplasm of upper-inner quadrant of right breast in female, estrogen receptor positive ([place])  04/27/2018 Initial Diagnosis   Screening mammogram detected right breast masses upper inner quadrant 7 mm and 4 mm, biopsy revealed grade 1 IDC ER 100%, PR 100%, HER-2 negative IHC 0, Ki-67 12%, T1BN0 stage Ia clinical stage   06/12/2018 Surgery   Right lumpectomy: 0.3 cm IDC with extracellular mucin, grade 1, with intermediate grade DCIS, margins negative, negative for lymphovascular or perineural invasion, 0/1 lymph node negative, ER 100%, PR 100%, HER-2 negative IHC 0, Ki-67 12%, T1 a N0 stage Ia   06/30/2018 Cancer Staging   Staging form: Breast, AJCC 8th Edition - Pathologic: Stage IA (pT1a, pN0, cM0, G1, ER+, PR+, HER2-) - Signed by Eppie Gibson, MD on 06/30/2018   07/21/2018 - 08/10/2018 Radiation Therapy   Adjuvant radiation   08/2018 -  Anti-estrogen oral therapy   Anastrozole daily     CHIEF COMPLIANT: Follow-up of right breast cancer on anastrozole  INTERVAL HISTORY: Caitlyn Mccoy is a 69 y.o. with above-mentioned history of right breast cancer treated with lumpectomy, radiation, and who is currently on antiestrogen therapy with anastrozole. Mammogram on 04/05/19 showed no evidence of malignancy bilaterally. She presents to the clinic today for follow-up.   Apart from the weight gain, she is tolerating it fairly well.  She does have  muscle aches and pains with stiffness in the fingers and legs.  ALLERGIES:  is allergic to penicillins and sulfa antibiotics.  MEDICATIONS:  Current Outpatient Medications  Medication Sig Dispense Refill  . acetaminophen (TYLENOL) 325 MG tablet Take 650 mg by mouth every 6 (six) hours as needed.    Marland Kitchen anastrozole (ARIMIDEX) 1 MG tablet Take 1 tablet (1 mg total) by mouth daily. 90 tablet 3  . fluticasone (FLONASE) 50 MCG/ACT nasal spray     . HYDROcodone-acetaminophen (NORCO/VICODIN) 5-325 MG tablet hydrocodone 5 mg-acetaminophen 325 mg tablet     No current facility-administered medications for this visit.    PHYSICAL EXAMINATION: ECOG PERFORMANCE STATUS: 1 - Symptomatic but completely ambulatory  Vitals:   08/10/19 1045  BP: (!) 139/92  Pulse: 66  Resp: 17  Temp: 98 F (36.7 C)  SpO2: 100%   Filed Weights   08/10/19 1045  Weight: 197 lb 1.6 oz (89.4 kg)    BREAST: No palpable masses or nodules in either right or left breasts. No palpable axillary supraclavicular or infraclavicular adenopathy no breast tenderness or nipple discharge. (exam performed in the presence of a chaperone)  LABORATORY DATA:  I have reviewed the data as listed CMP Latest Ref Rng & Units 02/05/2019 06/10/2018 03/17/2012  Glucose 70 - 99 mg/dL 84 89 69(L)  BUN 8 - 23 mg/dL 12 6(L) 16  Creatinine 0.44 - 1.00 mg/dL 1.02(H) 0.94 1.02  Sodium 135 - 145 mmol/L 144 145 140  Potassium 3.5 - 5.1 mmol/L 4.0 4.4 4.0  Chloride 98 - 111 mmol/L 109 112(H) 106  CO2 22 - 32 mmol/L 28  22 22  Calcium 8.9 - 10.3 mg/dL 9.7 9.3 9.7  Total Protein 6.5 - 8.1 g/dL 7.2 6.7 6.8  Total Bilirubin 0.3 - 1.2 mg/dL 0.6 0.6 1.1  Alkaline Phos 38 - 126 U/L 174(H) 150(H) 116  AST 15 - 41 U/L 33 126(H) 28  ALT 0 - 44 U/L 35 98(H) 35    Lab Results  Component Value Date   WBC 5.1 06/10/2018   HGB 13.5 06/10/2018   HCT 41.0 06/10/2018   MCV 91.9 06/10/2018   PLT 180 06/10/2018   NEUTROABS 3.2 06/10/2018    ASSESSMENT &  PLAN:  Malignant neoplasm of upper-inner quadrant of right breast in female, estrogen receptor positive ([place]) 06/12/2018:Right lumpectomy: 0.3 cm IDC with extracellular mucin, grade 1, with intermediate grade DCIS, margins negative, negative for lymphovascular or perineural invasion, 0/1 lymph node negative, ER 100%, PR 100%, HER-2 negative IHC 0, Ki-67 12%, T1 a N0 stage Ia  Treatment plan: 1.Adjuvant radiation therapy 07/21/2018-08/10/2018 2.Follow-up adjuvant antiestrogen therapy with anastrozole started 08/19/2018  Anastrozole toxicities: No major adverse effects to anastrozole. Weight gain: I discussed with her about intermittent fasting to cut down on the calories and carbohydrates. We will ask our dietitian to send her literature on weight loss. I encouraged her to use the Silver sneakers program to join the gym and exercise regularly.  Breast cancer surveillance 1.  Breast exam 08/10/2019: Benign 2.  Mammogram 04/05/2019: Benign breast density category C  Return to clinic in 1 year for follow-up    No orders of the defined types were placed in this encounter.  The patient has a good understanding of the overall plan. she agrees with it. she will call with any problems that may develop before the next visit here.  Total time spent: 20 mins including face to face time and time spent for planning, charting and coordination of care  Nicholas Lose, MD 08/10/2019  I, Cloyde Reams Dorshimer, am acting as scribe for Dr. Nicholas Lose.  I have reviewed the above documentation for accuracy and completeness, and I agree with the above.

## 2019-08-10 ENCOUNTER — Other Ambulatory Visit: Payer: Self-pay | Admitting: *Deleted

## 2019-08-10 ENCOUNTER — Other Ambulatory Visit: Payer: Self-pay

## 2019-08-10 ENCOUNTER — Telehealth: Payer: Self-pay | Admitting: Hematology and Oncology

## 2019-08-10 ENCOUNTER — Inpatient Hospital Stay: Payer: Medicare PPO | Attending: Hematology and Oncology | Admitting: Hematology and Oncology

## 2019-08-10 DIAGNOSIS — Z923 Personal history of irradiation: Secondary | ICD-10-CM | POA: Insufficient documentation

## 2019-08-10 DIAGNOSIS — Z79811 Long term (current) use of aromatase inhibitors: Secondary | ICD-10-CM | POA: Insufficient documentation

## 2019-08-10 DIAGNOSIS — C50211 Malignant neoplasm of upper-inner quadrant of right female breast: Secondary | ICD-10-CM | POA: Diagnosis present

## 2019-08-10 DIAGNOSIS — Z170 Estrogen receptor positive status [ER+]: Secondary | ICD-10-CM | POA: Diagnosis not present

## 2019-08-10 MED ORDER — ANASTROZOLE 1 MG PO TABS: 1.0000 mg | ORAL_TABLET | Freq: Every day | ORAL | 3 refills | Status: DC

## 2019-08-10 NOTE — Assessment & Plan Note (Signed)
06/12/2018:Right lumpectomy: 0.3 cm IDC with extracellular mucin, grade 1, with intermediate grade DCIS, margins negative, negative for lymphovascular or perineural invasion, 0/1 lymph node negative, ER 100%, PR 100%, HER-2 negative IHC 0, Ki-67 12%, T1 a N0 stage Ia  Treatment plan: 1.Adjuvant radiation therapy 07/21/2018-08/10/2018 2.Follow-up adjuvant antiestrogen therapy with anastrozole started 08/19/2018  Anastrozole toxicities:  Breast cancer surveillance 1.  Breast exam 08/10/2019: Benign 2.  Mammogram 04/05/2019: Benign breast density category C  Return to clinic in 1 year for follow-up

## 2019-08-10 NOTE — Progress Notes (Signed)
Per MD request referral placed for our dietitian to contact pt regarding weight loss management.

## 2019-08-10 NOTE — Telephone Encounter (Signed)
Scheduled appt per 4/6 sch message  - unable to reach pt . Left message with appt date and time   

## 2019-08-11 ENCOUNTER — Telehealth: Payer: Self-pay | Admitting: Nutrition

## 2019-08-11 NOTE — Telephone Encounter (Signed)
Patient called to confirm nutrition appointment with Jennet Maduro on Wednesday, April 28.  She has requested transportation services.  I sent an email to transportation services with information.

## 2019-08-12 ENCOUNTER — Telehealth: Payer: Self-pay | Admitting: Hematology and Oncology

## 2019-08-12 NOTE — Telephone Encounter (Signed)
Scheduled per 04/06 los, patient has been called and voicemail was left. 

## 2019-08-17 ENCOUNTER — Other Ambulatory Visit: Payer: Self-pay

## 2019-08-17 ENCOUNTER — Inpatient Hospital Stay: Payer: Medicare PPO

## 2019-08-17 NOTE — Progress Notes (Signed)
Nutrition Assessment   Reason for Assessment: Referral for weight loss and healthy eating   ASSESSMENT:  69 year old female with right breast cancer.  Patient s/p lumpectomy in Feb and radiation.  Patient currently taking anastrozole.    Met with patient today in clinic.  Patient interested in learning more about healthy diet after treatment and weight loss.  Patient reports that she has gained weight due to COVID restrictions and staying at home.     Medications: reviewed   Labs: reviewed   Anthropometrics:   Height: 4 ft 11 inches Weight: 197 lb 1.6 oz BMI: 39   NUTRITION DIAGNOSIS:  Food and nutrition related knowledge deficit related to increased weight as evidenced by seeking education from RD for healthy lifestyle.   INTERVENTION:  Discussed current recommendations regarding nutrition for survivorship.  Handout provided. Discussed weight loss tips and techniques.  Materials from AND provided to patient along with sample meal plan.   Provided reliable resources for patient (FormerBoss.no and www.aicr.org) to review.  If more intense weight loss management wanted would recommend referral to [facility].   Contact information provided.   Next Visit: no follow-up  Kely Dohn B. Zenia Resides, [place], [place] Registered Dietitian [phone] (pager)

## 2019-12-01 IMAGING — US US AXILLARY RIGHT
1 series · 3 of 3 positions shown · non-contrast
Comparison: April 2018 AND May 2018

CLINICAL DATA: 67-year-old patient recently diagnosed with right
breast cancer following stereotactic biopsy of a 7 mm mass in the
posterior third of the upper inner quadrant.

EXAM:
ULTRASOUND OF THE RIGHT AXILLA

[Series 1: us axillary right · 0.07mm/px · 3 of 3 slices shown]
[im 1/3]
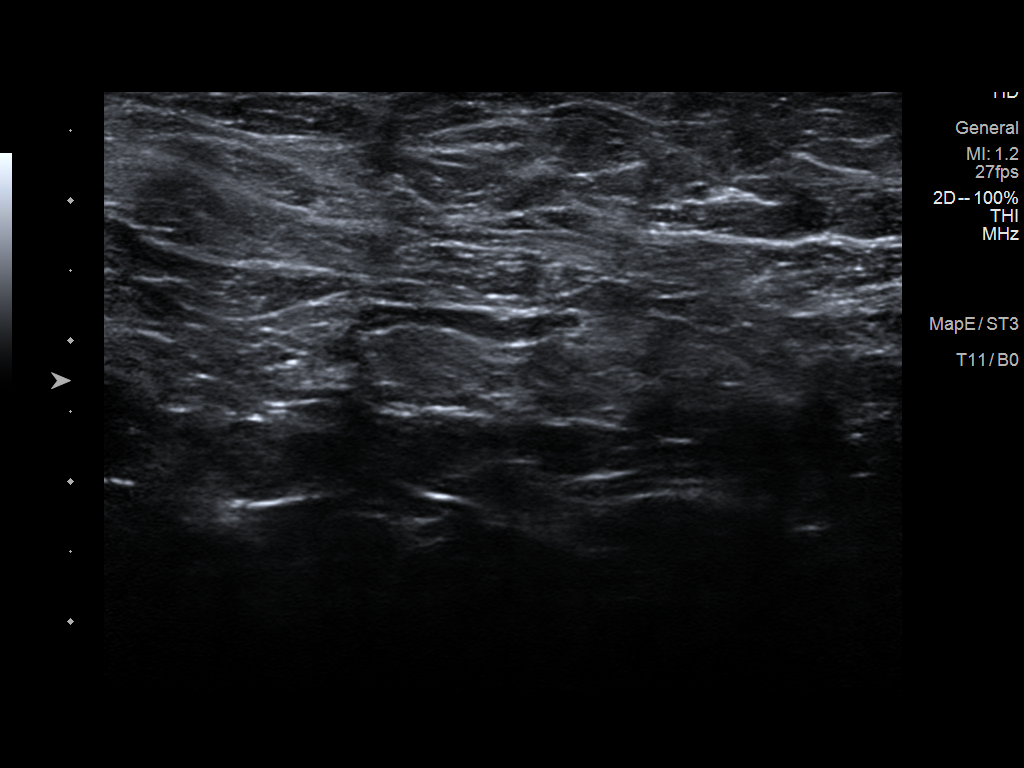
[im 2/3]
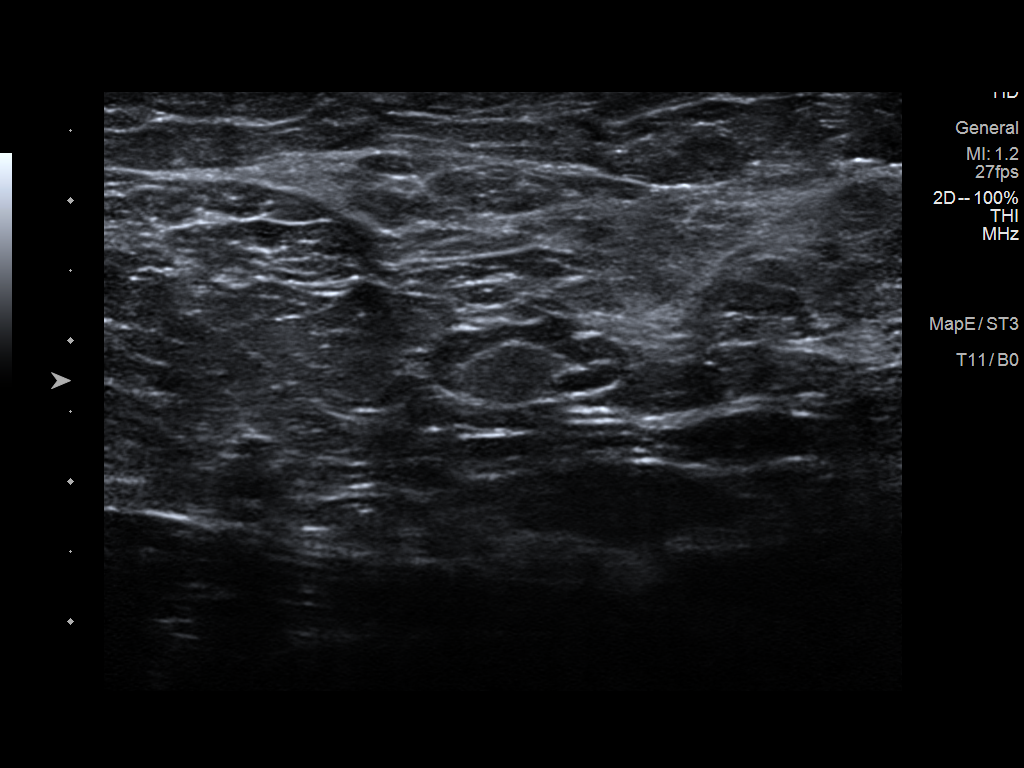
[im 3/3]
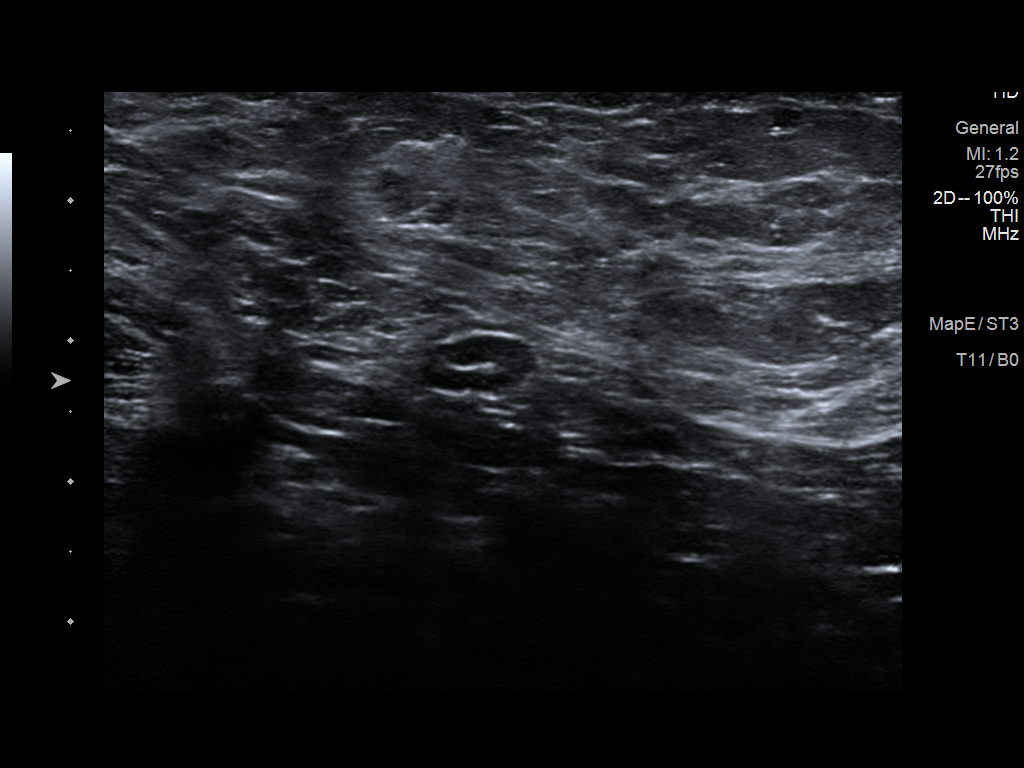

[3 of 3 positions shown; findings below may reference images not displayed]

FINDINGS: Targeted ultrasound is performed, showing normal right axillary
lymph nodes. No enlarged lymph nodes or cortical thickening is
identified.
IMPRESSION: Normal sonographic appearance of right axillary lymph nodes.
Negative for right axillary lymphadenopathy.

RECOMMENDATION:
Treatment planning for recently diagnosed right breast cancer.

I have discussed the findings and recommendations with the patient.
Results were also provided in writing at the conclusion of the
visit. If applicable, a reminder letter will be sent to the patient
regarding the next appointment.

BI-RADS CATEGORY  1: Negative.

## 2019-12-11 IMAGING — MG MM PLC BREAST LOC DEV 1ST LESION INC*R*
3 series · 3 of 3 positions shown · non-contrast
Comparison: Previous exam(s).

CLINICAL DATA: 60-year-old female with recently diagnosed invasive
ductal carcinoma of the right breast presents for radioactive seed
localization.

EXAM:
MAMMOGRAPHIC GUIDED RADIOACTIVE SEED LOCALIZATION OF THE RIGHT
BREAST

[R ML (1 of 2)]
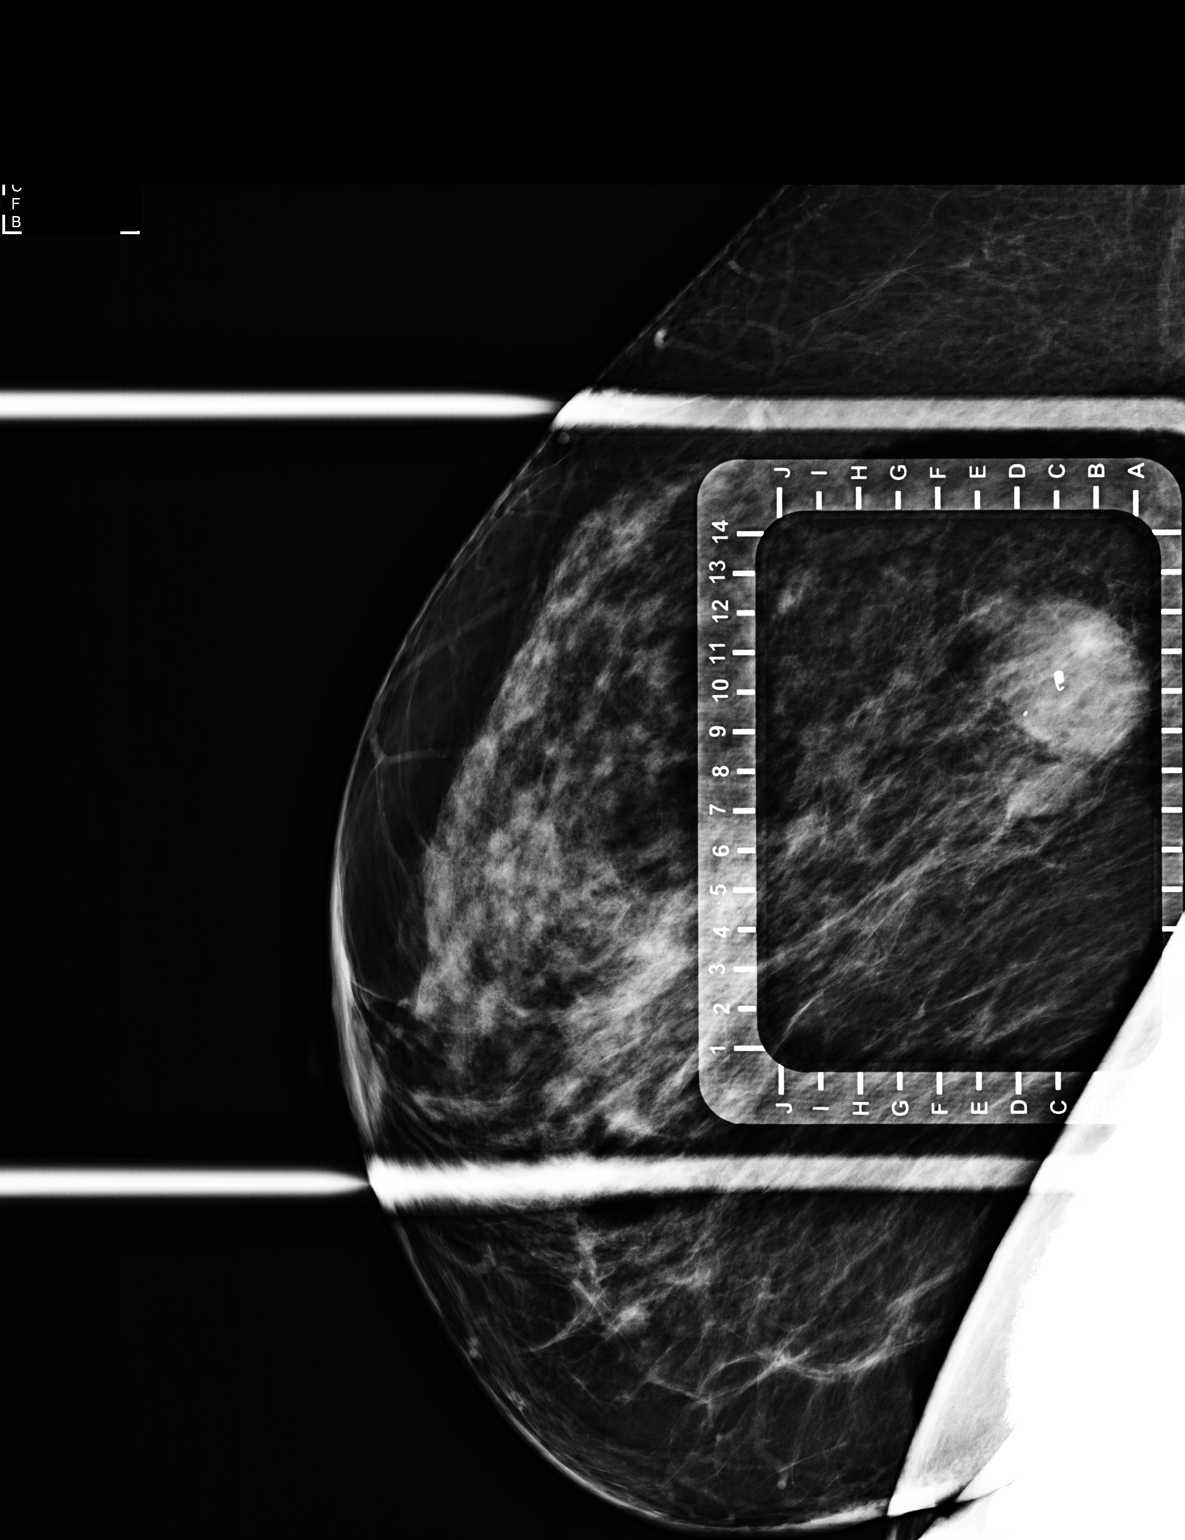

[R ML (2 of 2)]
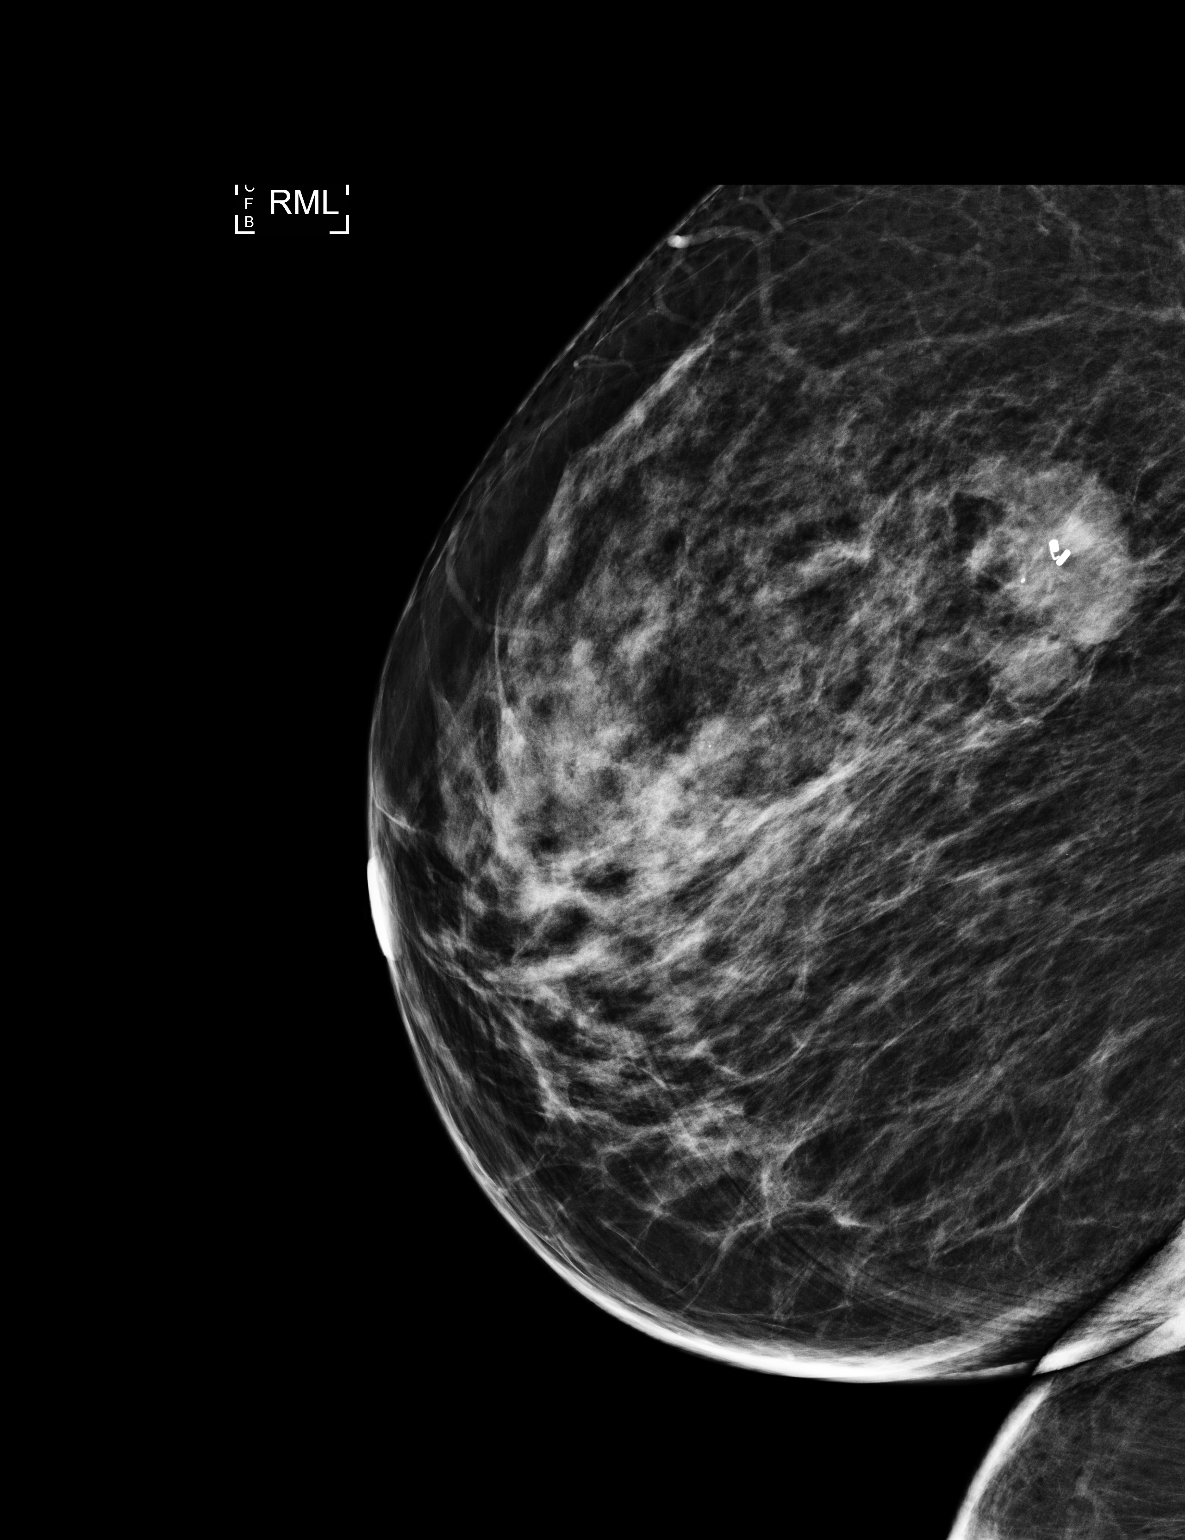

[R CC]
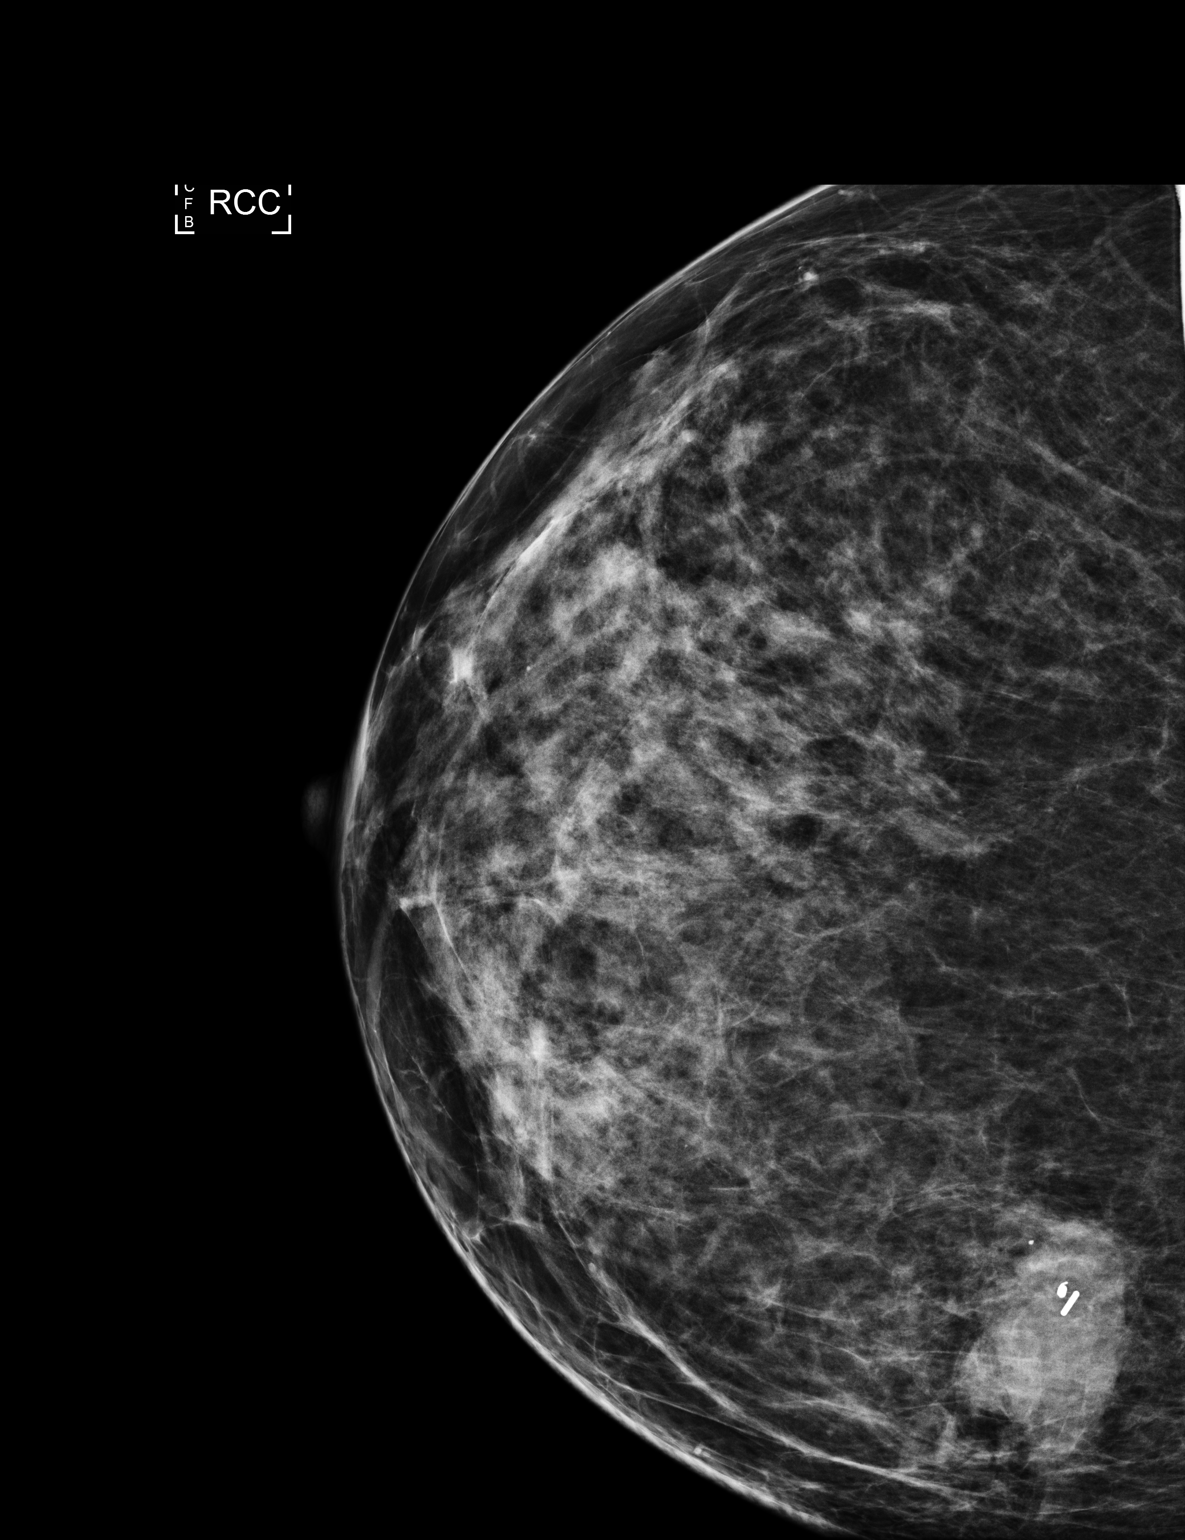

[3 of 3 positions shown; findings below may reference images not displayed]

FINDINGS: Patient presents for radioactive seed localization prior to right
breast lumpectomy. I met with the patient and we discussed the
procedure of seed localization including benefits and alternatives.
We discussed the high likelihood of a successful procedure. We
discussed the risks of the procedure including infection, bleeding,
tissue injury and further surgery. We discussed the low dose of
radioactivity involved in the procedure. Informed, written consent
was given.

The usual time-out protocol was performed immediately prior to the
procedure.

The initial mammogram image today demonstrated a persistent hematoma
surrounding the biopsy marker clip measuring approximately 3.5 cm.
This was discussed with Dr. Dela Torre at 2 p.m. 06/11/2018. Given the
biopsy proven malignancy and biopsy marking clips are felt to both
reside within the hematoma, it was agreed to proceed with
radioactive seed placement.

Using mammographic guidance, sterile technique, 1% lidocaine and an
0-V24 radioactive seed, the biopsy marking clip at site of biopsied
malignancy was localized using a medial to lateral approach. The
follow-up mammogram images confirm the seed in the expected location
and were marked for Dr. Dela Torre.

Follow-up survey of the patient confirms presence of the radioactive
seed.

Order number of [id number] seed:  [id number].

Total activity:  0.255 millicuries reference Date: 05/01/2018

The patient tolerated the procedure well and was released from the
[REDACTED]. She was given instructions regarding seed removal.
IMPRESSION: Radioactive seed localization right breast. No apparent
complications.

## 2019-12-12 IMAGING — DX BREAST SURGICAL SPECIMEN
1 series · 2 of 2 positions shown · non-contrast
Comparison: Previous exam(s).

CLINICAL DATA: Evaluate surgical specimen following RIGHT
lumpectomy for breast cancer.

EXAM:
SPECIMEN RADIOGRAPH OF THE RIGHT BREAST

[Series 4: specimen digital x-ray, derived · right · 2 of 2 slices shown]
[im 1/2]
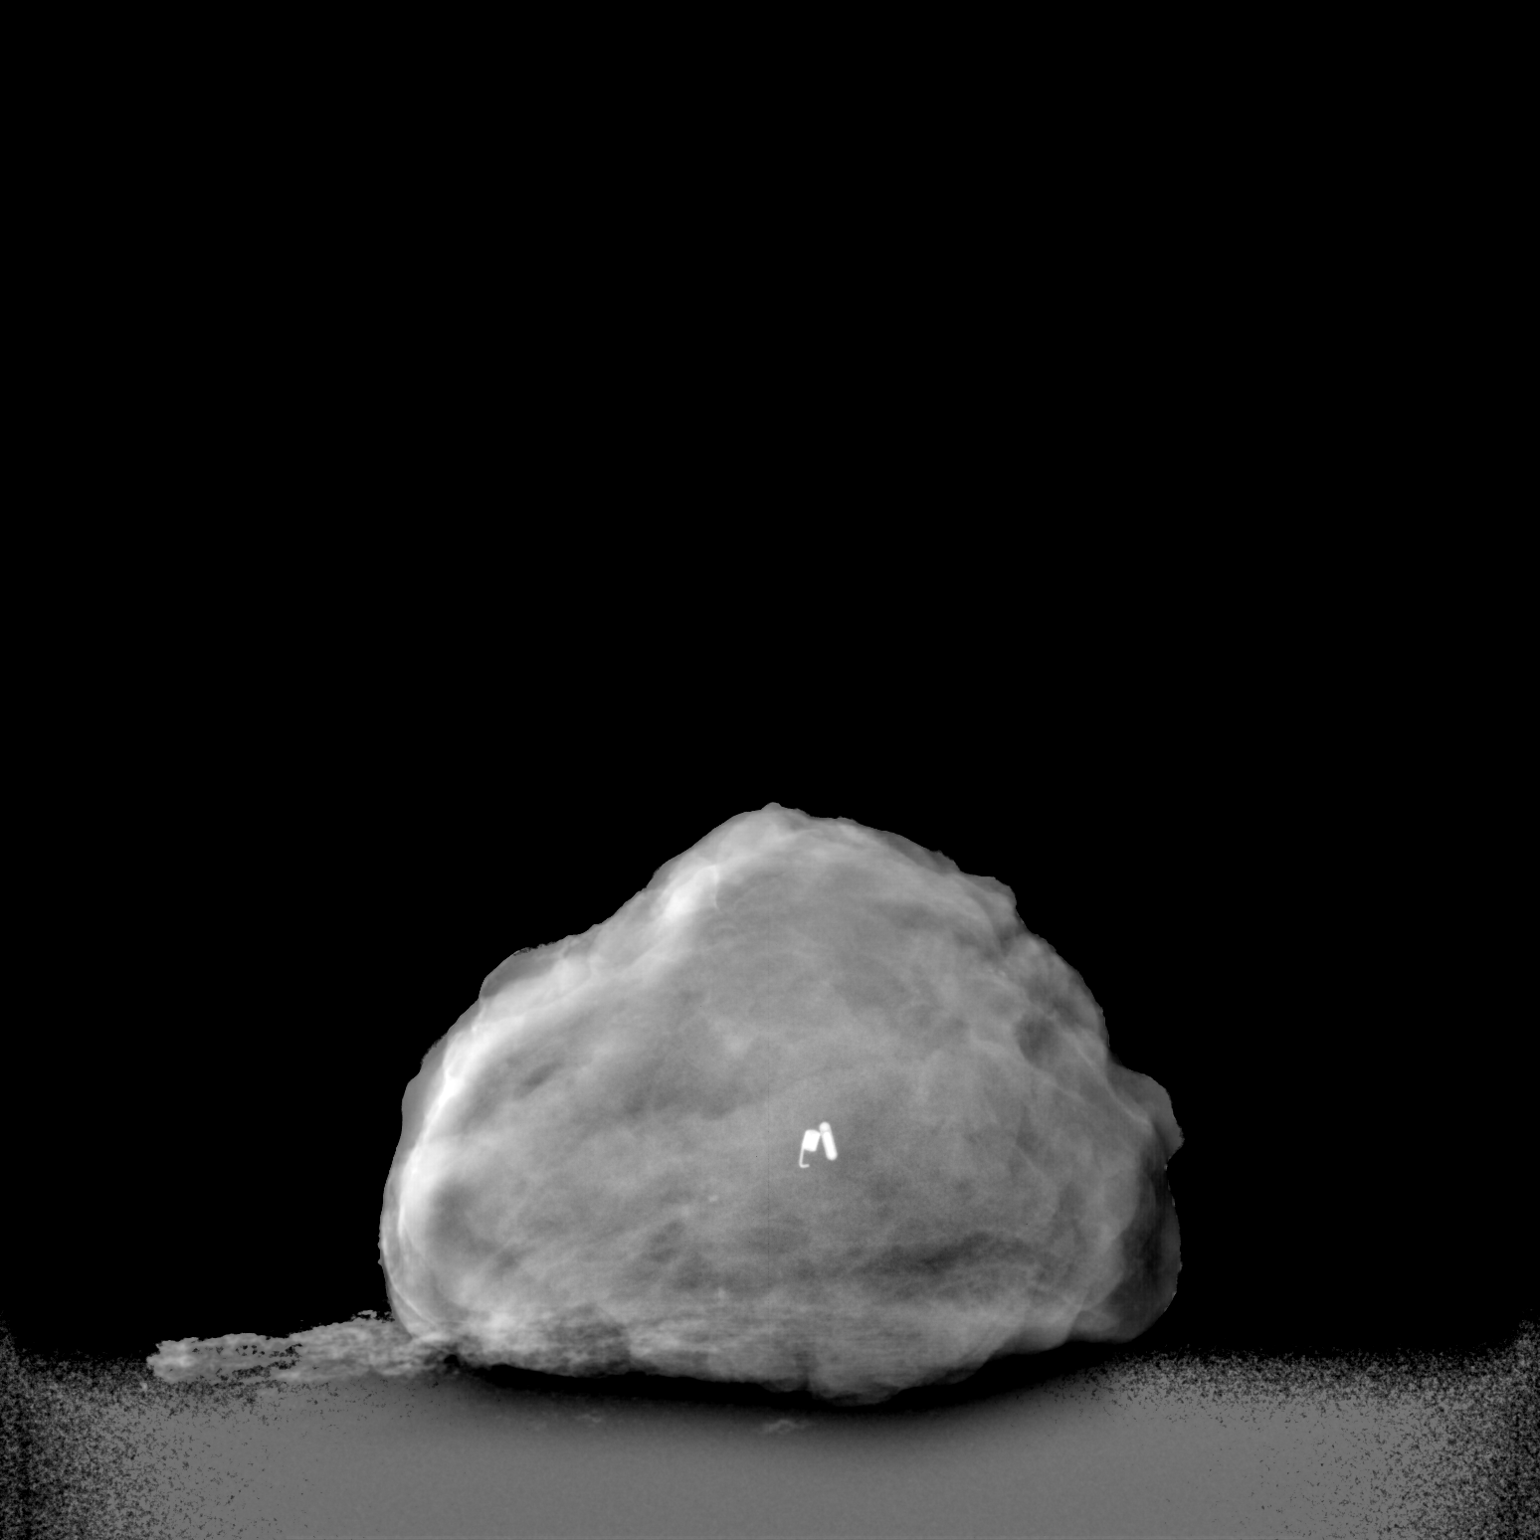
[im 2/2]
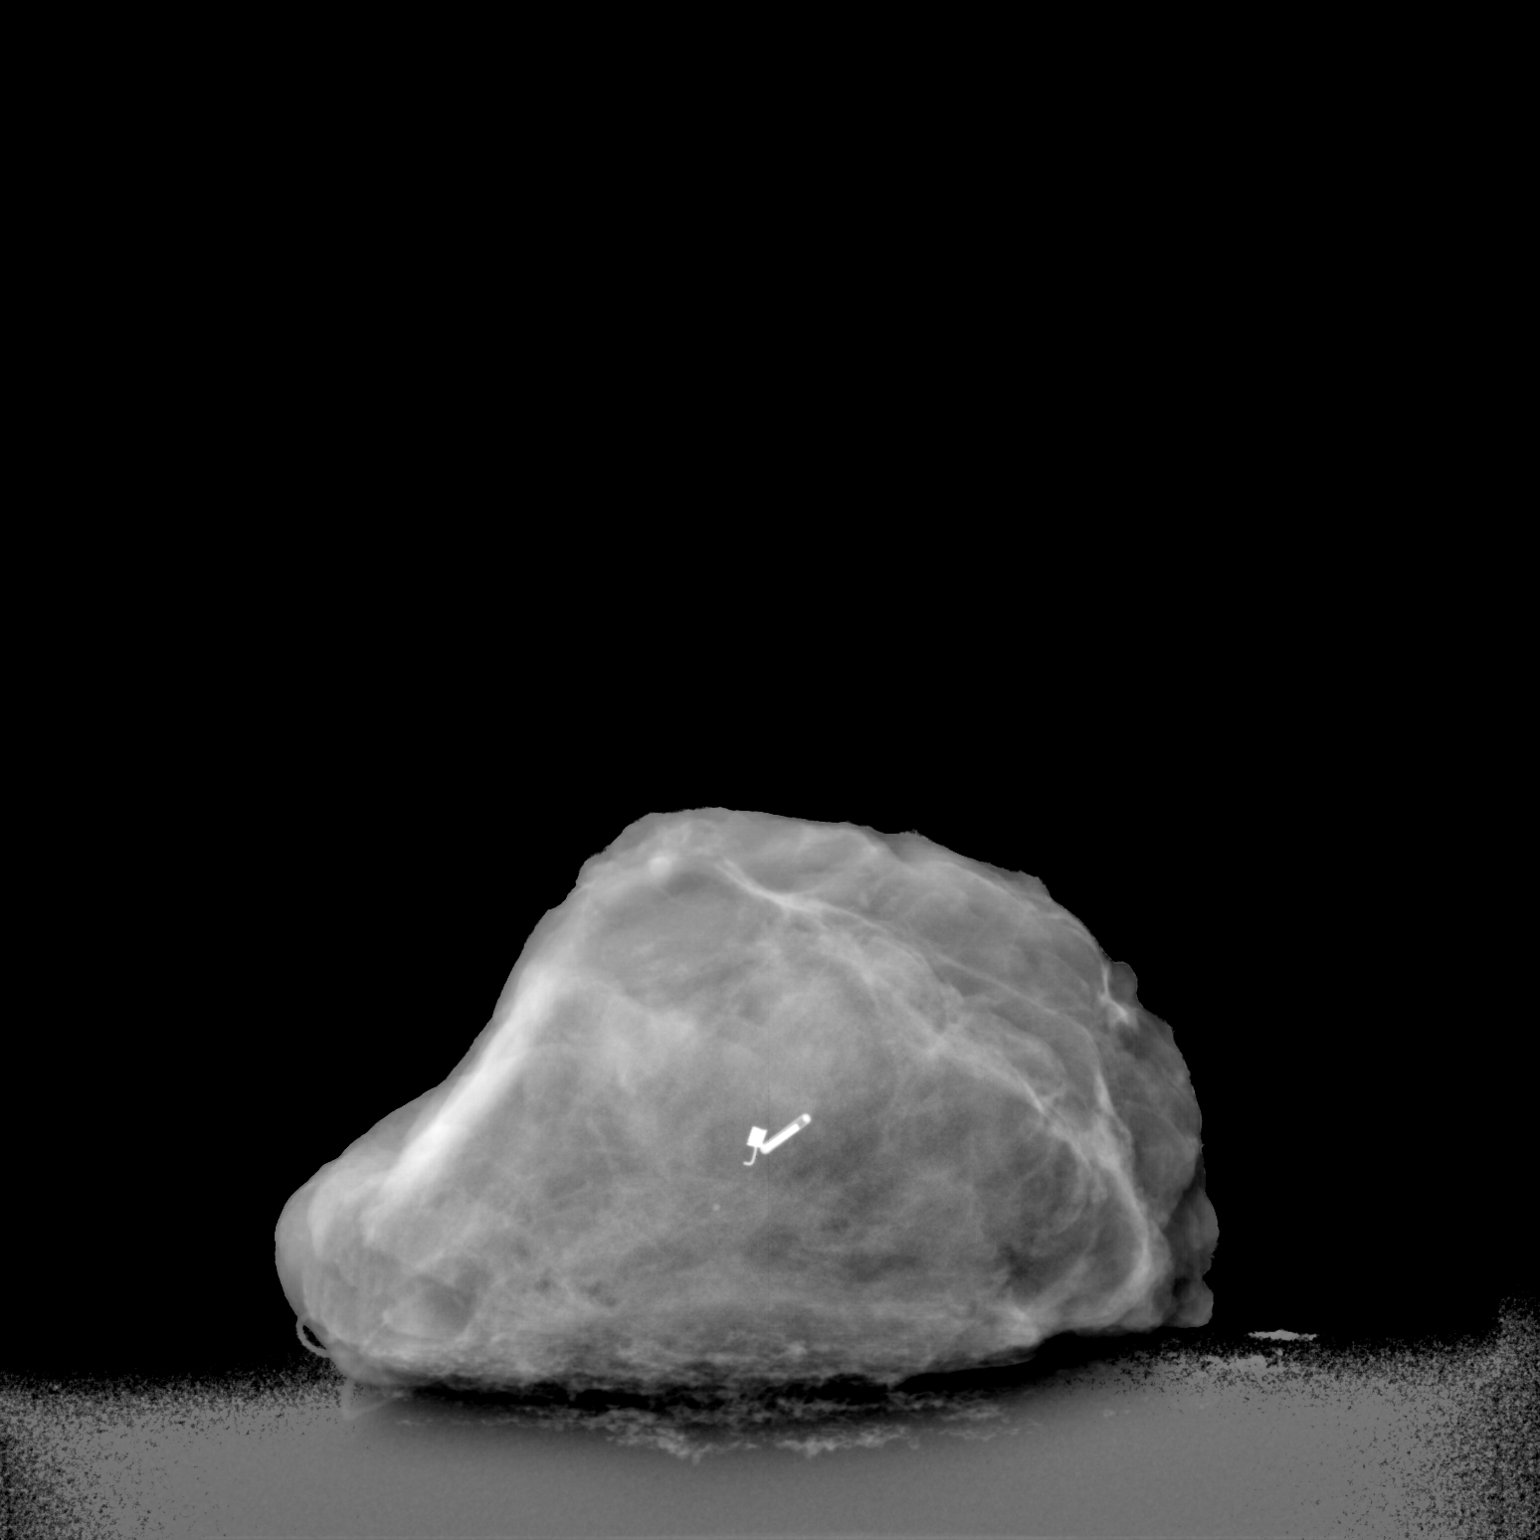

[2 of 2 positions shown; findings below may reference images not displayed]

FINDINGS: Status post excision of the RIGHT breast. The radioactive seed and
biopsy marker clip are present, completely intact, and were marked
for pathology.
IMPRESSION: Specimen radiograph of the RIGHT breast.

## 2020-03-24 ENCOUNTER — Other Ambulatory Visit: Payer: Self-pay | Admitting: Adult Health

## 2020-03-24 DIAGNOSIS — Z9889 Other specified postprocedural states: Secondary | ICD-10-CM

## 2020-05-03 ENCOUNTER — Other Ambulatory Visit: Payer: Self-pay

## 2020-05-03 ENCOUNTER — Ambulatory Visit
Admission: RE | Admit: 2020-05-03 | Discharge: 2020-05-03 | Disposition: A | Discharge status: Home / Self Care | Payer: Medicare PPO | Source: Ambulatory Visit | Attending: Adult Health | Admitting: Adult Health

## 2020-05-03 DIAGNOSIS — R922 Inconclusive mammogram: Secondary | ICD-10-CM | POA: Diagnosis not present

## 2020-05-03 DIAGNOSIS — Z9889 Other specified postprocedural states: Secondary | ICD-10-CM

## 2020-05-16 DIAGNOSIS — C50911 Malignant neoplasm of unspecified site of right female breast: Secondary | ICD-10-CM | POA: Diagnosis not present

## 2020-07-28 ENCOUNTER — Telehealth: Payer: Self-pay | Admitting: Hematology and Oncology

## 2020-07-28 NOTE — Telephone Encounter (Signed)
Per sch msg patient wanted to reschedule appt. Called and spoke with patient and she decided to keep appt as is. No changes were made

## 2020-08-02 DIAGNOSIS — Z13820 Encounter for screening for osteoporosis: Secondary | ICD-10-CM | POA: Diagnosis not present

## 2020-08-02 DIAGNOSIS — Z01419 Encounter for gynecological examination (general) (routine) without abnormal findings: Secondary | ICD-10-CM | POA: Diagnosis not present

## 2020-08-08 NOTE — Progress Notes (Signed)
Patient Care Team: Patient, No Pcp Per (Inactive) as PCP - General (General Practice) Nicholas Lose, MD as Consulting Physician (Hematology and Oncology) Eppie Gibson, MD as Attending Physician (Radiation Oncology) Fanny Skates, MD as Consulting Physician (General Surgery) Crawford Givens, MD as Consulting Physician (Obstetrics and Gynecology)  DIAGNOSIS:    ICD-10-CM   1. Malignant neoplasm of upper-inner quadrant of right breast in female, estrogen receptor positive ([place])  C50.211    Z17.0     SUMMARY OF ONCOLOGIC HISTORY: Oncology History  Malignant neoplasm of upper-inner quadrant of right breast in female, estrogen receptor positive ([place])  04/27/2018 Initial Diagnosis   Screening mammogram detected right breast masses upper inner quadrant 7 mm and 4 mm, biopsy revealed grade 1 IDC ER 100%, PR 100%, HER-2 negative IHC 0, Ki-67 12%, T1BN0 stage Ia clinical stage   06/12/2018 Surgery   Right lumpectomy: 0.3 cm IDC with extracellular mucin, grade 1, with intermediate grade DCIS, margins negative, negative for lymphovascular or perineural invasion, 0/1 lymph node negative, ER 100%, PR 100%, HER-2 negative IHC 0, Ki-67 12%, T1 a N0 stage Ia   06/30/2018 Cancer Staging   Staging form: Breast, AJCC 8th Edition - Pathologic: Stage IA (pT1a, pN0, cM0, G1, ER+, PR+, HER2-) - Signed by Eppie Gibson, MD on 06/30/2018   07/21/2018 - 08/10/2018 Radiation Therapy   Adjuvant radiation   08/2018 -  Anti-estrogen oral therapy   Anastrozole daily     CHIEF COMPLIANT: Follow-up of right breast cancer on anastrozole  INTERVAL HISTORY: Caitlyn Mccoy is a 70 y.o. with above-mentioned history of right breast cancer treated with lumpectomy, radiation, and who is currently on antiestrogen therapy with anastrozole.Mammogram on 05/03/20 showed no evidence of malignancy bilaterally. She presents to the clinictoday for follow-up.    Her major complaint today is severe pains in her back and shoulder  and pelvis as well as the right side of the chest wall.  ALLERGIES:  is allergic to penicillins and sulfa antibiotics.  MEDICATIONS:  Current Outpatient Medications  Medication Sig Dispense Refill  . acetaminophen (TYLENOL) 325 MG tablet Take 650 mg by mouth every 6 (six) hours as needed.    Marland Kitchen anastrozole (ARIMIDEX) 1 MG tablet Take 1 tablet (1 mg total) by mouth daily. 90 tablet 3  . fluticasone (FLONASE) 50 MCG/ACT nasal spray     . HYDROcodone-acetaminophen (NORCO/VICODIN) 5-325 MG tablet hydrocodone 5 mg-acetaminophen 325 mg tablet     No current facility-administered medications for this visit.    PHYSICAL EXAMINATION: ECOG PERFORMANCE STATUS: 1 - Symptomatic but completely ambulatory  Vitals:   08/09/20 1108  BP: 111/60  Pulse: 99  Resp: 17  Temp: (!) 97.2 F (36.2 C)  SpO2: 100%   Filed Weights   08/09/20 1108  Weight: 206 lb 14.4 oz (93.8 kg)    BREAST: No palpable masses or nodules in either right or left breasts. No palpable axillary supraclavicular or infraclavicular adenopathy no breast tenderness or nipple discharge. (exam performed in the presence of a chaperone)  LABORATORY DATA:  I have reviewed the data as listed CMP Latest Ref Rng & Units 02/05/2019 06/10/2018 03/17/2012  Glucose 70 - 99 mg/dL 84 89 69(L)  BUN 8 - 23 mg/dL 12 6(L) 16  Creatinine 0.44 - 1.00 mg/dL 1.02(H) 0.94 1.02  Sodium 135 - 145 mmol/L 144 145 140  Potassium 3.5 - 5.1 mmol/L 4.0 4.4 4.0  Chloride 98 - 111 mmol/L 109 112(H) 106  CO2 22 - 32 mmol/L 28 22 22  Calcium 8.9 - 10.3 mg/dL 9.7 9.3 9.7  Total Protein 6.5 - 8.1 g/dL 7.2 6.7 6.8  Total Bilirubin 0.3 - 1.2 mg/dL 0.6 0.6 1.1  Alkaline Phos 38 - 126 U/L 174(H) 150(H) 116  AST 15 - 41 U/L 33 126(H) 28  ALT 0 - 44 U/L 35 98(H) 35    Lab Results  Component Value Date   WBC 5.1 06/10/2018   HGB 13.5 06/10/2018   HCT 41.0 06/10/2018   MCV 91.9 06/10/2018   PLT 180 06/10/2018   NEUTROABS 3.2 06/10/2018    ASSESSMENT & PLAN:   Malignant neoplasm of upper-inner quadrant of right breast in female, estrogen receptor positive ([place]) 06/12/2018:Right lumpectomy: 0.3 cm IDC with extracellular mucin, grade 1, with intermediate grade DCIS, margins negative, negative for lymphovascular or perineural invasion, 0/1 lymph node negative, ER 100%, PR 100%, HER-2 negative IHC 0, Ki-67 12%, T1 a N0 stage Ia  Treatment plan: 1.Adjuvant radiation therapy3/17/2020-08/10/2018 2.Follow-up adjuvant antiestrogen therapy with anastrozole started 08/19/2018  Anastrozole toxicities:  Severe arthralgias and myalgias. Weight gain: I discussed with her about intermittent fasting to cut down on the calories and carbohydrates. I believe her obesity is the main cause of a lot of her problems including muscle and joint pains and also intolerance to antiestrogen therapy. At this time I instructed her to stop anastrozole and I will contact her in 4 weeks to assess if her symptoms improved. If her symptoms improved by stopping anastrozole and we will talk about switching her to letrozole.  Severe back issues and inability to sleep lying back: I recommended an adjustable mattress that can help her sleep better and alleviate some of the discomfort.  It would also help alleviate the pain related to the prior treatment for breast cancer.  Also recommended walk-in shower given her difficulty with raising her legs because of hip and pelvic pain.  She is at high risk of falls.  She is able to walk with the help of a cane and a walker. I discussed with her that it is unclear if any of these durable equipment will be covered by her insurance but we will send the fax to the equipment providers and see what happens.  Breast cancer surveillance 1.  Breast exam 08/09/2020: Benign 2.  Mammogram 05/03/20: Benign breast density category C  Telephone visit in 4 weeks to assess how she did by stopping anastrozole therapy.    No orders of the defined types were  placed in this encounter.  The patient has a good understanding of the overall plan. she agrees with it. she will call with any problems that may develop before the next visit here.  Total time spent: 30 mins including face to face time and time spent for planning, charting and coordination of care  Rulon Eisenmenger, MD, MPH 08/09/2020  I, Cloyde Reams Dorshimer, am acting as scribe for Dr. Nicholas Lose.  I have reviewed the above documentation for accuracy and completeness, and I agree with the above.

## 2020-08-09 ENCOUNTER — Other Ambulatory Visit: Payer: Self-pay

## 2020-08-09 ENCOUNTER — Inpatient Hospital Stay: Payer: Medicare PPO | Attending: Hematology and Oncology | Admitting: Hematology and Oncology

## 2020-08-09 DIAGNOSIS — R635 Abnormal weight gain: Secondary | ICD-10-CM | POA: Insufficient documentation

## 2020-08-09 DIAGNOSIS — C50211 Malignant neoplasm of upper-inner quadrant of right female breast: Secondary | ICD-10-CM | POA: Insufficient documentation

## 2020-08-09 DIAGNOSIS — Z79811 Long term (current) use of aromatase inhibitors: Secondary | ICD-10-CM | POA: Insufficient documentation

## 2020-08-09 DIAGNOSIS — Z923 Personal history of irradiation: Secondary | ICD-10-CM | POA: Diagnosis not present

## 2020-08-09 DIAGNOSIS — Z9221 Personal history of antineoplastic chemotherapy: Secondary | ICD-10-CM | POA: Insufficient documentation

## 2020-08-09 DIAGNOSIS — M2550 Pain in unspecified joint: Secondary | ICD-10-CM | POA: Insufficient documentation

## 2020-08-09 DIAGNOSIS — Z170 Estrogen receptor positive status [ER+]: Secondary | ICD-10-CM | POA: Diagnosis not present

## 2020-08-09 DIAGNOSIS — Z79899 Other long term (current) drug therapy: Secondary | ICD-10-CM | POA: Diagnosis not present

## 2020-08-09 NOTE — Progress Notes (Signed)
Per MD request, RN successfully faxed scripts to [facility] [phone]) pt walk in shower and adjustable bed.

## 2020-08-09 NOTE — Assessment & Plan Note (Signed)
06/12/2018:Right lumpectomy: 0.3 cm IDC with extracellular mucin, grade 1, with intermediate grade DCIS, margins negative, negative for lymphovascular or perineural invasion, 0/1 lymph node negative, ER 100%, PR 100%, HER-2 negative IHC 0, Ki-67 12%, T1 a N0 stage Ia  Treatment plan: 1.Adjuvant radiation therapy3/17/2020-08/10/2018 2.Follow-up adjuvant antiestrogen therapy with anastrozole started 08/19/2018  Anastrozole toxicities: No major adverse effects to anastrozole. Weight gain: I discussed with her about intermittent fasting to cut down on the calories and carbohydrates.  Breast cancer surveillance 1.  Breast exam 08/09/2020: Benign 2.  Mammogram 05/03/20: Benign breast density category C  Return to clinic in 1 year for follow-up

## 2020-09-05 NOTE — Assessment & Plan Note (Signed)
06/12/2018:Right lumpectomy: 0.3 cm IDC with extracellular mucin, grade 1, with intermediate grade DCIS, margins negative, negative for lymphovascular or perineural invasion, 0/1 lymph node negative, ER 100%, PR 100%, HER-2 negative IHC 0, Ki-67 12%, T1 a N0 stage Ia  Treatment plan: 1.Adjuvant radiation therapy3/17/2020-08/10/2018 2.Follow-up adjuvant antiestrogen therapywith anastrozole started 08/19/2018  Anastrozoletoxicities: Severe arthralgias and myalgias. (Stopped) Weight gain: I discussed with her about intermittent fasting to cut down on the calories and carbohydrates. I believe her obesity is the main cause of a lot of her problems   Breast cancer surveillance 1.Breast exam 08/09/2020: Benign 2.Mammogram 05/03/20: Benign breast density category C  Telephone visit in 4 weeks to assess how she did by stopping anastrozole therapy.

## 2020-09-05 NOTE — Progress Notes (Signed)
HEMATOLOGY-ONCOLOGY TELEPHONE VISIT PROGRESS NOTE  I connected with Caitlyn Mccoy on 09/06/2020 at 10:45 AM EDT by telephone and verified that I am speaking with the correct person using two identifiers.  I discussed the limitations, risks, security and privacy concerns of performing an evaluation and management service by telephone and the availability of in person appointments.  I also discussed with the patient that there may be a patient responsible charge related to this service. The patient expressed understanding and agreed to proceed.   History of Present Illness: Caitlyn Mccoy is a 70 y.o. female with above-mentioned history of right breast cancer treated with lumpectomy,radiation, and who was on antiestrogen therapy with anastrozole, but it was stopped 4 weeks ago due to muscle and joint pain. She presents over the phonetodayfor follow-up.She reports that since she stopped anastrozole her joint pains have markedly improved.  Oncology History  Malignant neoplasm of upper-inner quadrant of right breast in female, estrogen receptor positive ([place])  04/27/2018 Initial Diagnosis   Screening mammogram detected right breast masses upper inner quadrant 7 mm and 4 mm, biopsy revealed grade 1 IDC ER 100%, PR 100%, HER-2 negative IHC 0, Ki-67 12%, T1BN0 stage Ia clinical stage   06/12/2018 Surgery   Right lumpectomy: 0.3 cm IDC with extracellular mucin, grade 1, with intermediate grade DCIS, margins negative, negative for lymphovascular or perineural invasion, 0/1 lymph node negative, ER 100%, PR 100%, HER-2 negative IHC 0, Ki-67 12%, T1 a N0 stage Ia   06/30/2018 Cancer Staging   Staging form: Breast, AJCC 8th Edition - Pathologic: Stage IA (pT1a, pN0, cM0, G1, ER+, PR+, HER2-) - Signed by Eppie Gibson, MD on 06/30/2018   07/21/2018 - 08/10/2018 Radiation Therapy   Adjuvant radiation   08/2018 -  Anti-estrogen oral therapy   Anastrozole daily     Observations/Objective:      Assessment Plan:  Malignant neoplasm of upper-inner quadrant of right breast in female, estrogen receptor positive ([place]) 06/12/2018:Right lumpectomy: 0.3 cm IDC with extracellular mucin, grade 1, with intermediate grade DCIS, margins negative, negative for lymphovascular or perineural invasion, 0/1 lymph node negative, ER 100%, PR 100%, HER-2 negative IHC 0, Ki-67 12%, T1 a N0 stage Ia  Treatment plan: 1.Adjuvant radiation therapy3/17/2020-08/10/2018 2.Follow-up adjuvant antiestrogen therapywith anastrozole started 08/19/2018 stopped April 2021, switched to letrozole 09/12/20   Weight gain: I discussed with her about intermittent fasting to cut down on the calories and carbohydrates. I believe her obesity is the main cause of a lot of her problems   Breast cancer surveillance 1.Breast exam 08/09/2020: Benign 2.Mammogram 05/03/20: Benign breast density category C  Patient is requesting an adjustable bed and a walk-in bathtub because of her limitations.  I will refer this to social work to be in touch with her regarding her options.    I discussed the assessment and treatment plan with the patient. The patient was provided an opportunity to ask questions and all were answered. The patient agreed with the plan and demonstrated an understanding of the instructions. The patient was advised to call back or seek an in-person evaluation if the symptoms worsen or if the condition fails to improve as anticipated.   Total time spent: 12 mins including non-face to face time and time spent for planning, charting and coordination of care  Rulon Eisenmenger, MD 09/06/2020    I, Cloyde Reams Dorshimer, am acting as scribe for Nicholas Lose, MD.  I have reviewed the above documentation for accuracy and completeness, and I agree with the  above.

## 2020-09-06 ENCOUNTER — Encounter: Payer: Self-pay | Admitting: *Deleted

## 2020-09-06 ENCOUNTER — Inpatient Hospital Stay: Payer: Medicare PPO | Attending: Hematology and Oncology | Admitting: Hematology and Oncology

## 2020-09-06 DIAGNOSIS — C50211 Malignant neoplasm of upper-inner quadrant of right female breast: Secondary | ICD-10-CM

## 2020-09-06 DIAGNOSIS — Z170 Estrogen receptor positive status [ER+]: Secondary | ICD-10-CM | POA: Diagnosis not present

## 2020-09-06 MED ORDER — LETROZOLE 2.5 MG PO TABS: 2.5000 mg | ORAL_TABLET | Freq: Every day | ORAL | 3 refills | Status: DC

## 2020-09-06 NOTE — Progress Notes (Signed)
Pt requesting order for full walk in shower to be placed in her home as well as a completely adjustable bed.  RN placed call to [facility] and was informed that these two items are considered "luxary" and would not be covered by pt insurance.  Pt notified and verbalized understanding.

## 2020-09-07 ENCOUNTER — Other Ambulatory Visit: Payer: Self-pay | Admitting: *Deleted

## 2020-09-07 ENCOUNTER — Encounter: Payer: Self-pay | Admitting: Licensed Clinical Social Worker

## 2020-09-07 DIAGNOSIS — Z170 Estrogen receptor positive status [ER+]: Secondary | ICD-10-CM

## 2020-09-07 NOTE — Progress Notes (Signed)
    Durable Medical Equipment  (From admission, onward)         Start     Ordered   09/07/20 0000  For home use only DME Hospital bed       Question Answer Comment  Length of Need Lifetime   Patient has (list medical condition): Morbid Obesity   The above medical condition requires: Patient requires the ability to reposition frequently   Bed type Semi-electric   Support Surface: Gel Overlay      09/07/20 1457

## 2020-09-07 NOTE — Progress Notes (Signed)
[place] Work  Clinical Social Work was referred by Therapist, sports for assistance locating ways to help patient obtain a full walk in shower and adjustable bed as these items are not covered by insurance.  Clinical Social Worker contacted patient by phone. Provided information on three agencies that may be able to help with the walk-in shower if it is considered medically necessary, although likely will not be able to assist with the bed.    Provided the following: [facility]- [place]- [phone]     Christeen Douglas, [place]

## 2020-12-07 DIAGNOSIS — C50911 Malignant neoplasm of unspecified site of right female breast: Secondary | ICD-10-CM | POA: Diagnosis not present

## 2021-03-09 ENCOUNTER — Other Ambulatory Visit: Payer: Self-pay | Admitting: Adult Health

## 2021-03-09 DIAGNOSIS — Z853 Personal history of malignant neoplasm of breast: Secondary | ICD-10-CM

## 2021-05-04 ENCOUNTER — Other Ambulatory Visit: Payer: Self-pay

## 2021-05-04 ENCOUNTER — Ambulatory Visit
Admission: RE | Admit: 2021-05-04 | Discharge: 2021-05-04 | Disposition: A | Discharge status: Home / Self Care | Payer: Medicare PPO | Source: Ambulatory Visit | Attending: Adult Health | Admitting: Adult Health

## 2021-05-04 DIAGNOSIS — Z853 Personal history of malignant neoplasm of breast: Secondary | ICD-10-CM | POA: Diagnosis not present

## 2021-05-04 DIAGNOSIS — R922 Inconclusive mammogram: Secondary | ICD-10-CM | POA: Diagnosis not present

## 2021-07-18 ENCOUNTER — Other Ambulatory Visit: Payer: Self-pay | Admitting: Hematology and Oncology

## 2021-07-18 ENCOUNTER — Other Ambulatory Visit: Payer: Self-pay | Admitting: *Deleted

## 2021-08-08 DIAGNOSIS — Z6841 Body Mass Index (BMI) 40.0 and over, adult: Secondary | ICD-10-CM | POA: Diagnosis not present

## 2021-08-08 DIAGNOSIS — Z01419 Encounter for gynecological examination (general) (routine) without abnormal findings: Secondary | ICD-10-CM | POA: Diagnosis not present

## 2021-08-08 NOTE — Progress Notes (Signed)
? ?Patient Care Team: ?Patient, No Pcp Per (Inactive) as PCP - General (General Practice) ?Nicholas Lose, MD as Consulting Physician (Hematology and Oncology) ?Eppie Gibson, MD as Attending Physician (Radiation Oncology) ?Fanny Skates, MD as Consulting Physician (General Surgery) ?Crawford Givens, MD as Consulting Physician (Obstetrics and Gynecology) ? ?DIAGNOSIS:  ?Encounter Diagnosis  ?Name Primary?  ? Malignant neoplasm of upper-inner quadrant of right breast in female, estrogen receptor positive ([place])   ? ? ?SUMMARY OF ONCOLOGIC HISTORY: ?Oncology History  ?Malignant neoplasm of upper-inner quadrant of right breast in female, estrogen receptor positive ([place])  ?04/27/2018 Initial Diagnosis  ? Screening mammogram detected right breast masses upper inner quadrant 7 mm and 4 mm, biopsy revealed grade 1 IDC ER 100%, PR 100%, HER-2 negative IHC 0, Ki-67 12%, T1BN0 stage Ia clinical stage ? ?  ?06/12/2018 Surgery  ? Right lumpectomy: 0.3 cm IDC with extracellular mucin, grade 1, with intermediate grade DCIS, margins negative, negative for lymphovascular or perineural invasion, 0/1 lymph node negative, ER 100%, PR 100%, HER-2 negative IHC 0, Ki-67 12%, T1 a N0 stage Ia ? ?  ?06/30/2018 Cancer Staging  ? Staging form: Breast, AJCC 8th Edition ?- Pathologic: Stage IA (pT1a, pN0, cM0, G1, ER+, PR+, HER2-) - Signed by Eppie Gibson, MD on 06/30/2018 ? ?  ?07/21/2018 - 08/10/2018 Radiation Therapy  ? Adjuvant radiation ? ?  ?08/2018 -  Anti-estrogen oral therapy  ? Anastrozole daily ?  ? ? ?CHIEF COMPLIANT: Follow-up of right breast cancer on anastrozole ? ?INTERVAL HISTORY: Caitlyn Mccoy is a 71 y.o. female with above-mentioned history of right breast cancer treated with lumpectomy, radiation, and who was on antiestrogen therapy with anastrozole, She presents to the clinic for a follow-up. She states breast is doing ok. Complains of an reaction from the detergent she has been using. She states that she is tolerating the  Letrozole. ?She complained of weight gain.  She has difficulty with ambulation and requires a cane for assistance.  She does not have transportation either. ? ?ALLERGIES:  is allergic to penicillins and sulfa antibiotics. ? ?MEDICATIONS:  ?Current Outpatient Medications  ?Medication Sig Dispense Refill  ? acetaminophen (TYLENOL) 325 MG tablet Take 650 mg by mouth every 6 (six) hours as needed.    ? fluticasone (FLONASE) 50 MCG/ACT nasal spray     ? HYDROcodone-acetaminophen (NORCO/VICODIN) 5-325 MG tablet hydrocodone 5 mg-acetaminophen 325 mg tablet    ? letrozole (FEMARA) 2.5 MG tablet Take 1 tablet (2.5 mg total) by mouth daily. 90 tablet 3  ? ?No current facility-administered medications for this visit.  ? ? ?PHYSICAL EXAMINATION: ?ECOG PERFORMANCE STATUS: 1 - Symptomatic but completely ambulatory ? ?Vitals:  ? 08/20/21 1121  ?BP: (!) 153/81  ?Pulse: 67  ?Resp: 18  ?Temp: 97.8 ?F (36.6 ?C)  ?SpO2: 99%  ? ?Filed Weights  ? 08/20/21 1121  ?Weight: 225 lb 4.8 oz (102.2 kg)  ? ? ?BREAST: No palpable masses or nodules in either right or left breasts. No palpable axillary supraclavicular or infraclavicular adenopathy no breast tenderness or nipple discharge. (exam performed in the presence of a chaperone) ? ?LABORATORY DATA:  ?I have reviewed the data as listed ? ?  Latest Ref Rng & Units 02/05/2019  ? 11:12 AM 06/10/2018  ? 10:30 AM 03/17/2012  ?  5:12 PM  ?CMP  ?Glucose 70 - 99 mg/dL 84   89   69    ?BUN 8 - 23 mg/dL '12   6   16    ' ?Creatinine  0.44 - 1.00 mg/dL 1.02   0.94   1.02    ?Sodium 135 - 145 mmol/L 144   145   140    ?Potassium 3.5 - 5.1 mmol/L 4.0   4.4   4.0    ?Chloride 98 - 111 mmol/L 109   112   106    ?CO2 22 - 32 mmol/L '28   22   22    ' ?Calcium 8.9 - 10.3 mg/dL 9.7   9.3   9.7    ?Total Protein 6.5 - 8.1 g/dL 7.2   6.7   6.8    ?Total Bilirubin 0.3 - 1.2 mg/dL 0.6   0.6   1.1    ?Alkaline Phos 38 - 126 U/L 174   150   116    ?AST 15 - 41 U/L 33   126   28    ?ALT 0 - 44 U/L 35   98   35    ? ? ?Lab  Results  ?Component Value Date  ? WBC 5.1 06/10/2018  ? HGB 13.5 06/10/2018  ? HCT 41.0 06/10/2018  ? MCV 91.9 06/10/2018  ? PLT 180 06/10/2018  ? NEUTROABS 3.2 06/10/2018  ? ? ?ASSESSMENT & PLAN:  ?Malignant neoplasm of upper-inner quadrant of right breast in female, estrogen receptor positive ([place]) ?06/12/2018:Right lumpectomy: 0.3 cm IDC with extracellular mucin, grade 1, with intermediate grade DCIS, margins negative, negative for lymphovascular or perineural invasion, 0/1 lymph node negative, ER 100%, PR 100%, HER-2 negative IHC 0, Ki-67 12%, T1 a N0 stage Ia ?  ?Treatment plan: ?1.  Adjuvant radiation therapy 07/21/2018-08/10/2018 ?2.  Follow-up adjuvant antiestrogen therapy with anastrozole started 08/19/2018 stopped April 2021, switched to letrozole 09/12/20  ?  ?Weight gain: I discussed with her about intermittent fasting to cut down on the calories and carbohydrates. ?I believe her obesity is the main cause of a lot of her problems  ?  ?Breast cancer surveillance ?1.  Breast exam 08/20/2021: Benign ?2.  Mammogram 05/04/2021: Benign breast density category C ? ?Return to clinic in 1 year for follow-up ? ? ? ?No orders of the defined types were placed in this encounter. ? ?The patient has a good understanding of the overall plan. she agrees with it. she will call with any problems that may develop before the next visit here. ?Total time spent: 30 mins including face to face time and time spent for planning, charting and co-ordination of care ? ? Harriette Ohara, MD ?08/20/21 ? ? ? I Gardiner Coins am scribing for Dr. Lindi Adie ? ?I have reviewed the above documentation for accuracy and completeness, and I agree with the above. ?  ?

## 2021-08-20 ENCOUNTER — Other Ambulatory Visit: Payer: Self-pay

## 2021-08-20 ENCOUNTER — Inpatient Hospital Stay: Payer: Medicare PPO | Attending: Hematology and Oncology | Admitting: Hematology and Oncology

## 2021-08-20 ENCOUNTER — Inpatient Hospital Stay: Payer: Medicare PPO

## 2021-08-20 DIAGNOSIS — Z170 Estrogen receptor positive status [ER+]: Secondary | ICD-10-CM | POA: Diagnosis not present

## 2021-08-20 DIAGNOSIS — C50211 Malignant neoplasm of upper-inner quadrant of right female breast: Secondary | ICD-10-CM | POA: Diagnosis not present

## 2021-08-20 DIAGNOSIS — Z853 Personal history of malignant neoplasm of breast: Secondary | ICD-10-CM | POA: Insufficient documentation

## 2021-08-20 MED ORDER — LETROZOLE 2.5 MG PO TABS: 2.5000 mg | ORAL_TABLET | Freq: Every day | ORAL | 3 refills | Status: DC

## 2021-08-20 NOTE — Assessment & Plan Note (Signed)
06/12/2018:Right lumpectomy: 0.3 cm IDC with extracellular mucin, grade 1, with intermediate grade DCIS, margins negative, negative for lymphovascular or perineural invasion, 0/1 lymph node negative, ER 100%, PR 100%, HER-2 negative IHC 0, Ki-67 12%, T1 a N0 stage Ia ?? ?Treatment plan: ?1.??Adjuvant radiation therapy?07/21/2018-08/10/2018 ?2.??Follow-up adjuvant antiestrogen therapy?with anastrozole started 08/19/2018 stopped April 2021, switched to letrozole 09/12/20? ?? ?Weight gain: I discussed with her about intermittent fasting to cut down on the calories and carbohydrates. ?I believe her obesity is the main cause of a lot of her problems  ?? ?Breast cancer surveillance ?1.??Breast exam 08/20/2021: Benign ?2.??Mammogram?05/04/2021: Benign breast density category C ? ?Return to clinic in 1 year for follow-up ?

## 2022-01-30 ENCOUNTER — Ambulatory Visit: Payer: Medicare PPO | Admitting: Student

## 2022-01-30 ENCOUNTER — Encounter: Payer: Self-pay | Admitting: Student

## 2022-01-30 ENCOUNTER — Other Ambulatory Visit: Payer: Self-pay

## 2022-01-30 VITALS — BP 156/93 | HR 90 | Temp 97.9°F | Ht 59.0 in | Wt 228.6 lb

## 2022-01-30 DIAGNOSIS — Z0000 Encounter for general adult medical examination without abnormal findings: Secondary | ICD-10-CM

## 2022-01-30 DIAGNOSIS — E785 Hyperlipidemia, unspecified: Secondary | ICD-10-CM | POA: Diagnosis not present

## 2022-01-30 DIAGNOSIS — E559 Vitamin D deficiency, unspecified: Secondary | ICD-10-CM | POA: Diagnosis not present

## 2022-01-30 DIAGNOSIS — I639 Cerebral infarction, unspecified: Secondary | ICD-10-CM | POA: Diagnosis not present

## 2022-01-30 DIAGNOSIS — Z87891 Personal history of nicotine dependence: Secondary | ICD-10-CM

## 2022-01-30 DIAGNOSIS — R7303 Prediabetes: Secondary | ICD-10-CM

## 2022-01-30 DIAGNOSIS — I10 Essential (primary) hypertension: Secondary | ICD-10-CM | POA: Diagnosis not present

## 2022-01-30 DIAGNOSIS — E041 Nontoxic single thyroid nodule: Secondary | ICD-10-CM | POA: Diagnosis not present

## 2022-01-30 DIAGNOSIS — F3289 Other specified depressive episodes: Secondary | ICD-10-CM

## 2022-01-30 DIAGNOSIS — E049 Nontoxic goiter, unspecified: Secondary | ICD-10-CM

## 2022-01-30 DIAGNOSIS — Z1159 Encounter for screening for other viral diseases: Secondary | ICD-10-CM | POA: Diagnosis not present

## 2022-01-30 DIAGNOSIS — C50211 Malignant neoplasm of upper-inner quadrant of right female breast: Secondary | ICD-10-CM

## 2022-01-30 DIAGNOSIS — Z8673 Personal history of transient ischemic attack (TIA), and cerebral infarction without residual deficits: Secondary | ICD-10-CM

## 2022-01-30 DIAGNOSIS — Z170 Estrogen receptor positive status [ER+]: Secondary | ICD-10-CM

## 2022-01-30 LAB — POCT GLYCOSYLATED HEMOGLOBIN (HGB A1C): Hemoglobin A1C: 5.8 % — AB (ref 4.0–5.6)

## 2022-01-30 MED ORDER — AMLODIPINE BESYLATE 5 MG PO TABS: 5.0000 mg | ORAL_TABLET | Freq: Every day | ORAL | 3 refills | Status: DC

## 2022-01-30 MED ORDER — ROSUVASTATIN CALCIUM 20 MG PO TABS: 20.0000 mg | ORAL_TABLET | Freq: Every day | ORAL | 3 refills | Status: DC

## 2022-01-30 NOTE — Progress Notes (Signed)
To the chest pain  New Patient Office Visit  Subjective    Patient ID: Caitlyn Mccoy, female    DOB: 1950-11-15  Age: 71 y.o. MRN: [id number]  CC:  Chief Complaint  Patient presents with   Follow-up    ROUTINE OFFICE VISIT TO BE ESTABLISHED/  PATIENT NEW TO CLINIC / CHECK UP / STIFFNESS IN KNEES # 5    HPI Caitlyn Mccoy is 71 year old person presents to establish care.   Outpatient Encounter Medications as of 01/30/2022  Medication Sig   amLODipine (NORVASC) 5 MG tablet Take 1 tablet (5 mg total) by mouth daily.   rosuvastatin (CRESTOR) 20 MG tablet Take 1 tablet (20 mg total) by mouth daily.   acetaminophen (TYLENOL) 325 MG tablet Take 650 mg by mouth every 6 (six) hours as needed.   fluticasone (FLONASE) 50 MCG/ACT nasal spray    HYDROcodone-acetaminophen (NORCO/VICODIN) 5-325 MG tablet hydrocodone 5 mg-acetaminophen 325 mg tablet   letrozole (FEMARA) 2.5 MG tablet Take 1 tablet (2.5 mg total) by mouth daily.   No facility-administered encounter medications on file as of 01/30/2022.    Past Medical History:  Diagnosis Date   Blindness    left eye since birth   Breast cancer ([place]) 06/2018   right breast ca   Breast cancer of upper-inner quadrant of right female breast ([place]) 06/12/2018   GERD (gastroesophageal reflux disease)    Hiatal hernia    History of cervical dysplasia    in her 69s, underwent partial cervix removal and cryosurgery   Hypertension    Stroke [facility]) 2000   denies deficits   Yeast infection     Past Surgical History:  Procedure Laterality Date   BREAST LUMPECTOMY Right 06/2018   BREAST LUMPECTOMY WITH RADIOACTIVE SEED AND SENTINEL LYMPH NODE BIOPSY Right 06/12/2018   Procedure: RIGHT BREAST LUMPECTOMY WITH RADIOACTIVE SEED AND  RIGHT AXILLARY  DEEP SENTINEL LYMPH NODE BIOPSY WITH BLUE DYE INJECTION ERAS PATHWAY;  Surgeon: Fanny Skates, MD;  Location: [place];  Service: General;  Laterality: Right;  PECTORAL BLOCK   CAROTID  ENDARTERECTOMY  2001   TONSILLECTOMY     WISDOM TOOTH EXTRACTION      Family History  Problem Relation Age of Onset   Diabetes Mother    Heart attack Father    Breast cancer Neg Hx     Social History   Socioeconomic History   Marital status: Single    Spouse name: Not on file   Number of children: Not on file   Years of education: Not on file   Highest education level: Not on file  Occupational History   Not on file  Tobacco Use   Smoking status: Former    Packs/day: 0.50    Years: 20.00    Total pack years: 10.00    Types: Cigarettes    Quit date: 06/30/1989    Years since quitting: 32.6   Smokeless tobacco: Never  Vaping Use   Vaping Use: Never used  Substance and Sexual Activity   Alcohol use: No   Drug use: No   Sexual activity: Not Currently  Other Topics Concern   Not on file  Social History Narrative   Lives alone in [place], daughter also lives in [place]. Walks with a cane. Retired was a Software engineer the [employer]   Social Determinants of Radio broadcast assistant Strain: Not on [employer] Insecurity: Not on file  Transportation Needs: Scientific laboratory technician Needs (  06/30/2018)   PRAPARE - Hydrologist (Medical): Yes    Lack of Transportation (Non-Medical): No  Physical Activity: Not on file  Stress: Not on file  Social Connections: Not on file  Intimate Partner Violence: Not At Risk (06/30/2018)   Humiliation, Afraid, Rape, and Kick questionnaire    Fear of Current or Ex-Partner: No    Emotionally Abused: No    Physically Abused: No    Sexually Abused: No    Review of Systems  Constitutional:  Negative for chills and fever.  HENT:  Negative for congestion.   Respiratory:  Negative for shortness of breath.   Cardiovascular:  Negative for chest pain.  Musculoskeletal:  Positive for myalgias.  Psychiatric/Behavioral:  Positive for depression.   All other systems reviewed and are  negative.    Objective    BP (!) 156/93   Pulse 90   Temp 97.9 F (36.6 C) (Oral)   Ht '4\' 11"'$  (1.499 m)   Wt 228 lb 9.6 oz (103.7 kg)   SpO2 93%   BMI 46.17 kg/m   Physical Exam Constitutional:      Appearance: Normal appearance.  Eyes:     Comments: strabismus  Neck:     Comments: No thyromegaly or masses Cardiovascular:     Rate and Rhythm: Normal rate and regular rhythm.     Heart sounds: No murmur heard.    No friction rub. No gallop.  Pulmonary:     Effort: Pulmonary effort is normal.     Breath sounds: Normal breath sounds. No rhonchi or rales.  Abdominal:     General: Abdomen is flat. Bowel sounds are normal.     Palpations: Abdomen is soft.     Tenderness: There is no abdominal tenderness.  Musculoskeletal:     Cervical back: Neck supple. Tenderness present.     Right lower leg: No edema.     Left lower leg: No edema.  Skin:    General: Skin is warm and dry.  Neurological:     General: No focal deficit present.     Mental Status: She is alert and oriented to person, place, and time.  Psychiatric:        Mood and Affect: Mood normal.        Behavior: Behavior normal.     Last metabolic panel Lab Results  Component Value Date   GLUCOSE 84 02/05/2019   NA 144 02/05/2019   K 4.0 02/05/2019   CL 109 02/05/2019   CO2 28 02/05/2019   BUN 12 02/05/2019   CREATININE 1.02 (H) 02/05/2019   GFRNONAA 56 (L) 02/05/2019   CALCIUM 9.7 02/05/2019   PROT 7.2 02/05/2019   ALBUMIN 3.6 02/05/2019   BILITOT 0.6 02/05/2019   ALKPHOS 174 (H) 02/05/2019   AST 33 02/05/2019   ALT 35 02/05/2019   ANIONGAP 7 02/05/2019   Last lipids Lab Results  Component Value Date   CHOL 227 (H) 01/30/2022   HDL 48 01/30/2022   LDLCALC 159 (H) 01/30/2022   TRIG 114 01/30/2022   CHOLHDL 4.7 (H) 01/30/2022   Last hemoglobin A1c Lab Results  Component Value Date   HGBA1C 5.8 (A) 01/30/2022   Last thyroid functions Lab Results  Component Value Date   TSH 1.250  01/30/2022        Assessment & Plan:   Problem List Items Addressed This Visit       Cardiovascular and Mediastinum   Hypertension    BP  in office today 159/86 and 156/93 on repeat.  Prior measurements appear consistently elevated since 2020.  She is not currently on any medications for this.  She cannot recall if she was on medication before.  Start amlodipine 5 mg daily Follow-up in 4 weeks.      Relevant Medications   amLODipine (NORVASC) 5 MG tablet   rosuvastatin (CRESTOR) 20 MG tablet     Endocrine   Thyroid nodule    Patient reports prior history of goiter.  She appears to have had a RAIU scan and thyroid ultrasound in 2005 and noted to have a right thyroid nodule with increased uptake.  She reports discussing radioactive iodine and surgery in the past but did not choose to pursue this.  Has had no follow-up for this since then.  We will repeat TSH and thyroid ultrasound.        Other   Malignant neoplasm of upper-inner quadrant of right breast in female, estrogen receptor positive ([place])    Currently on letrozole appears to have had DEXA scans with her OB/GYN in the past we will request records.  Currently followed by Dr. Lindi Adie.      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  Not currently on statin therapy.  We will start her on high intensity statin for secondary prevention with rosuvastatin 20 mg daily.  Lipid panel today.      Relevant Medications   amLODipine (NORVASC) 5 MG tablet   rosuvastatin (CRESTOR) 20 MG tablet   Other Relevant Orders   Lipid Profile (Completed)   Vitamin D deficiency    Patient reports history of vitamin D deficiency and has been on 50,000 units weekly for treatment of seasonal affective disorder.  Appears last measurement 2 years ago of 25-hydroxy vitamin D was 59.  We will repeat her vitamin D today.       Relevant Orders   Vitamin D (25 hydroxy) (Completed)   Depression    Reports history of  seasonal affective disorder.  Has tried light therapy.  Does take high-dose vitamin D periodically for this in the past.  PHQ-9 today is 6.  We discussed that vitamin D is not a recommended treatment for seasonal affective disorder. offered her SSRI which she was not interested in.      Healthcare maintenance    She declined pneumonia and influenza vaccines. Hepatitis C screening performed today. We will discuss colon cancer screening at next visit.      Other Visit Diagnoses     Cerebrovascular accident (CVA), unspecified mechanism ([place])    -  Primary   Relevant Medications   amLODipine (NORVASC) 5 MG tablet   rosuvastatin (CRESTOR) 20 MG tablet   Prediabetes       Relevant Orders   POC Hbg A1C (Completed)   Thyroid goiter       Relevant Orders   TSH (Completed)   US THYROID   Encounter for hepatitis C screening test for low risk patient       Relevant Orders   Hepatitis C Ab reflex to Quant PCR (Completed)   History of TIA (transient ischemic attack)           Return in about 4 weeks (around 02/27/2022).   Iona Beard, MD

## 2022-01-30 NOTE — Patient Instructions (Addendum)
Blood pressure Start amlodipine 5 mg daily  High cholesterol  Rosuvastatin 20 mg daily We will check lipid panel  Thyroid  We will check thyroid studies and repeat and repeat a thyroid US   We will also check A1c  and vitamin D today as well  Follow up in 1 months

## 2022-01-31 ENCOUNTER — Telehealth: Payer: Self-pay | Admitting: Student

## 2022-01-31 LAB — TSH: TSH: 1.25 u[IU]/mL (ref 0.450–4.500)

## 2022-01-31 LAB — HCV AB W REFLEX TO QUANT PCR: HCV Ab: NONREACTIVE

## 2022-01-31 LAB — LIPID PANEL
Chol/HDL Ratio: 4.7 ratio — ABNORMAL HIGH (ref 0.0–4.4)
Cholesterol, Total: 227 mg/dL — ABNORMAL HIGH (ref 100–199)
HDL: 48 mg/dL (ref >39)
LDL Chol Calc (NIH): 159 mg/dL — ABNORMAL HIGH (ref 0–99)
Triglycerides: 114 mg/dL (ref 0–149)
VLDL Cholesterol Cal: 20 mg/dL (ref 5–40)

## 2022-01-31 LAB — HCV INTERPRETATION

## 2022-01-31 LAB — VITAMIN D 25 HYDROXY (VIT D DEFICIENCY, FRACTURES): Vit D, 25-Hydroxy: 29.1 ng/mL — ABNORMAL LOW (ref 30.0–100.0)

## 2022-01-31 NOTE — Telephone Encounter (Signed)
Called patient regarding lab results from [place] on 01/30/2022. We had discussed vitamin D levels. In th past this has been low but most recent measurement this have been normal. Repeat 25 hydroxy Vitamin D of 29. I recommended daily substitution with 600-800 iU daily She reports previously she was on high dose 50,000 units weekly for seasonal affective disorder. We discussed that high doses of vitamin D is not ideal in treating this. She state that she has tried light therapy and is uninterested in counseling or other medication as the vitamin D has worked well in the past. Offered her daily supplementation, however she is quite frustrated and state that she would like to be on the high dose. We discussed the risk of high dose vitamin D. She was very frustrated with this and  would like to speak with an attending provider at the clinic about this further. I discussed with with Dr. Evette Doffing who agreed with daily supplementation with lower dose Vitamin D.

## 2022-01-31 NOTE — Telephone Encounter (Signed)
I followed up with Caitlyn Mccoy. She was previously prescribed an 8 week course of high dose vitamin D by Dr. Marolyn Hammock with gynecology on a regular basis in the fall for seasonal affective disorder. She is new to our clinic. I agree that given her normal level of vitamin D, high dose vitamin D would pose risk of hypercalcemia. I offered a lower daily dose of vitamin D which she declined. Also declined first line therapy of SSRI for the seasonal affective disorder. She tells me that she is not satisfied with this decision. I expressed that we take patient safety seriously and would not prescribe medications if the risks outweigh the benefit.

## 2022-02-01 ENCOUNTER — Telehealth: Payer: Self-pay

## 2022-02-01 NOTE — Telephone Encounter (Signed)
Requesting to speak with Dr. Lisabeth Devoid for lab results, please call pt back.

## 2022-02-01 NOTE — Telephone Encounter (Signed)
Called patient, her daughter wanted to know her lab values and she had not written them down. Relayed the information to the patient.

## 2022-02-02 DIAGNOSIS — E559 Vitamin D deficiency, unspecified: Secondary | ICD-10-CM | POA: Insufficient documentation

## 2022-02-02 DIAGNOSIS — R7303 Prediabetes: Secondary | ICD-10-CM | POA: Insufficient documentation

## 2022-02-02 DIAGNOSIS — E041 Nontoxic single thyroid nodule: Secondary | ICD-10-CM | POA: Insufficient documentation

## 2022-02-02 DIAGNOSIS — Z0000 Encounter for general adult medical examination without abnormal findings: Secondary | ICD-10-CM | POA: Insufficient documentation

## 2022-02-02 DIAGNOSIS — I10 Essential (primary) hypertension: Secondary | ICD-10-CM | POA: Insufficient documentation

## 2022-02-02 DIAGNOSIS — E785 Hyperlipidemia, unspecified: Secondary | ICD-10-CM | POA: Insufficient documentation

## 2022-02-02 DIAGNOSIS — F32A Depression, unspecified: Secondary | ICD-10-CM | POA: Insufficient documentation

## 2022-02-02 NOTE — Assessment & Plan Note (Signed)
She declined pneumonia and influenza vaccines. Hepatitis C screening performed today. We will discuss colon cancer screening at next visit.

## 2022-02-02 NOTE — Assessment & Plan Note (Signed)
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  Not currently on statin therapy.  We will start her on high intensity statin for secondary prevention with rosuvastatin 20 mg daily.  Lipid panel today.

## 2022-02-02 NOTE — Assessment & Plan Note (Signed)
Patient reports history of vitamin D deficiency and has been on 50,000 units weekly for treatment of seasonal affective disorder.  Appears last measurement 2 years ago of 25-hydroxy vitamin D was 59.  We will repeat her vitamin D today.

## 2022-02-02 NOTE — Assessment & Plan Note (Signed)
Patient reports prior history of goiter.  She appears to have had a RAIU scan and thyroid ultrasound in 2005 and noted to have a right thyroid nodule with increased uptake.  She reports discussing radioactive iodine and surgery in the past but did not choose to pursue this.  Has had no follow-up for this since then.  We will repeat TSH and thyroid ultrasound.

## 2022-02-02 NOTE — Assessment & Plan Note (Signed)
Reports history of seasonal affective disorder.  Has tried light therapy.  Does take high-dose vitamin D periodically for this in the past.  PHQ-9 today is 6.  We discussed that vitamin D is not a recommended treatment for seasonal affective disorder. offered her SSRI which she was not interested in.

## 2022-02-02 NOTE — Assessment & Plan Note (Signed)
A1c today.  

## 2022-02-02 NOTE — Assessment & Plan Note (Signed)
BP in office today 159/86 and 156/93 on repeat.  Prior measurements appear consistently elevated since 2020.  She is not currently on any medications for this.  She cannot recall if she was on medication before.  Start amlodipine 5 mg daily Follow-up in 4 weeks.

## 2022-02-02 NOTE — Assessment & Plan Note (Addendum)
Currently on letrozole appears to have had DEXA scans with her OB/GYN in the past we will request records.  Currently followed by Dr. Lindi Adie.

## 2022-02-11 NOTE — Progress Notes (Signed)
Internal Medicine Clinic Attending ? ?Case discussed with Dr. Liang  At the time of the visit.  We reviewed the resident?s history and exam and pertinent patient test results.  I agree with the assessment, diagnosis, and plan of care documented in the resident?s note. ? ?

## 2022-02-11 NOTE — Addendum Note (Signed)
Addended by: Charise Killian on: 02/11/2022 02:23 PM   Modules accepted: Level of Service

## 2022-02-19 ENCOUNTER — Ambulatory Visit (HOSPITAL_COMMUNITY): Payer: Medicare PPO

## 2022-05-03 ENCOUNTER — Other Ambulatory Visit: Payer: Self-pay | Admitting: Adult Health

## 2022-05-03 DIAGNOSIS — Z1231 Encounter for screening mammogram for malignant neoplasm of breast: Secondary | ICD-10-CM

## 2022-05-08 ENCOUNTER — Ambulatory Visit
Admission: RE | Admit: 2022-05-08 | Discharge: 2022-05-08 | Disposition: A | Discharge status: Home / Self Care | Payer: Medicare PPO | Source: Ambulatory Visit | Attending: Adult Health | Admitting: Adult Health

## 2022-05-08 DIAGNOSIS — Z1231 Encounter for screening mammogram for malignant neoplasm of breast: Secondary | ICD-10-CM

## 2022-05-27 ENCOUNTER — Ambulatory Visit: Payer: Medicare PPO | Attending: Family Medicine | Admitting: Family Medicine

## 2022-05-27 ENCOUNTER — Encounter: Payer: Self-pay | Admitting: Family Medicine

## 2022-05-27 VITALS — BP 148/85 | HR 109 | Temp 98.0°F | Ht 59.0 in | Wt 222.0 lb

## 2022-05-27 DIAGNOSIS — Z8673 Personal history of transient ischemic attack (TIA), and cerebral infarction without residual deficits: Secondary | ICD-10-CM

## 2022-05-27 DIAGNOSIS — R7303 Prediabetes: Secondary | ICD-10-CM

## 2022-05-27 DIAGNOSIS — C50211 Malignant neoplasm of upper-inner quadrant of right female breast: Secondary | ICD-10-CM

## 2022-05-27 DIAGNOSIS — E559 Vitamin D deficiency, unspecified: Secondary | ICD-10-CM

## 2022-05-27 DIAGNOSIS — I10 Essential (primary) hypertension: Secondary | ICD-10-CM

## 2022-05-27 DIAGNOSIS — E785 Hyperlipidemia, unspecified: Secondary | ICD-10-CM

## 2022-05-27 DIAGNOSIS — Z170 Estrogen receptor positive status [ER+]: Secondary | ICD-10-CM | POA: Diagnosis not present

## 2022-05-27 DIAGNOSIS — E041 Nontoxic single thyroid nodule: Secondary | ICD-10-CM

## 2022-05-27 DIAGNOSIS — R21 Rash and other nonspecific skin eruption: Secondary | ICD-10-CM | POA: Diagnosis not present

## 2022-05-27 DIAGNOSIS — Z1211 Encounter for screening for malignant neoplasm of colon: Secondary | ICD-10-CM

## 2022-05-27 MED ORDER — TRIAMCINOLONE ACETONIDE 0.1 % EX CREA: 1.0000 | TOPICAL_CREAM | Freq: Two times a day (BID) | CUTANEOUS | 1 refills | Status: DC

## 2022-05-27 NOTE — Progress Notes (Signed)
Subjective:  Patient ID: Caitlyn Mccoy, female    DOB: 02-18-1951  Age: 72 y.o. MRN: [id number]  CC: New Patient (Initial Visit)   HPI Caitlyn Mccoy is a 72 y.o. year old female with a history of previous CVA, hypertension, hyperlipidemia, Right breast ca ( diagnosed 2020 s/p lumpectomy, adjuvant radiation, adjuvant antiestrogen therapy) on Femara , left eye visual loss, thyroid nodule ( radioactive uptake scan in 2005) She was last seen at the Internal Medicine Clinic in 01/2022 but decided to transfer care here.  Interval History:  Her blood pressure is elevated and she should be on amlodipine which she is not taking.  She should also be on Crestor which she has not been taking. She would like to work on her diet rather than take medications as she has written a list of side effects of each of these medications which she researched.  She is not willing to take this and is not open to taking other medications. Complains of sleeping a lot and feeling cold a lot.  Last thyroid panel was normal in 2023.  Dr Charlesetta Garibaldi is her OBGYN and she last had a visit in 08/08/2021. Last oncology visit was in 08/2021 with Dr. Lindi Adie and mammogram from 05/08/2022 was negative for malignancy. She complains of dry skin and has a dry patch on her lumbar region which has been very itchy.  Radioactive uptake scan from 2005: IMPRESSION:   Multinodular goiter.   With a low TSH and normal uptake, the hottest nodule in the lower pole of the left lobe of the gland, extending below the sternal notch, may be semi-autonomous.     Past Medical History:  Diagnosis Date   Blindness    left eye since birth   Breast cancer ([place]) 06/2018   right breast ca   Breast cancer of upper-inner quadrant of right female breast ([place]) 06/12/2018   GERD (gastroesophageal reflux disease)    Hiatal hernia    History of cervical dysplasia    in her 34s, underwent partial cervix removal and cryosurgery   Hypertension    Stroke [facility])  2000   denies deficits   Yeast infection     Past Surgical History:  Procedure Laterality Date   BREAST LUMPECTOMY Right 06/2018   BREAST LUMPECTOMY WITH RADIOACTIVE SEED AND SENTINEL LYMPH NODE BIOPSY Right 06/12/2018   Procedure: RIGHT BREAST LUMPECTOMY WITH RADIOACTIVE SEED AND  RIGHT AXILLARY  DEEP SENTINEL LYMPH NODE BIOPSY WITH BLUE DYE INJECTION ERAS PATHWAY;  Surgeon: Fanny Skates, MD;  Location: [place];  Service: General;  Laterality: Right;  PECTORAL BLOCK   CAROTID ENDARTERECTOMY  2001   TONSILLECTOMY     WISDOM TOOTH EXTRACTION      Family History  Problem Relation Age of Onset   Diabetes Mother    Heart attack Father    Breast cancer Neg Hx     Social History   Socioeconomic History   Marital status: Single    Spouse name: Not on file   Number of children: Not on file   Years of education: Not on file   Highest education level: Not on file  Occupational History   Not on file  Tobacco Use   Smoking status: Former    Packs/day: 0.50    Years: 20.00    Total pack years: 10.00    Types: Cigarettes    Quit date: 06/30/1989    Years since quitting: 32.9   Smokeless tobacco: Never  Vaping Use  Vaping Use: Never used  Substance and Sexual Activity   Alcohol use: No   Drug use: No   Sexual activity: Not Currently  Other Topics Concern   Not on file  Social History Narrative   Lives alone in [place], daughter also lives in [place]. Walks with a cane. 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     fluticasone (FLONASE) 50 MCG/ACT nasal spray      letrozole (FEMARA) 2.5 MG tablet Take 1 tablet (2.5 mg total) by mouth daily. 90 tablet 3   amLODipine (NORVASC) 5 MG tablet Take 1 tablet (5 mg total) by mouth daily. (Patient not taking: Reported on 05/27/2022) 90 tablet 3   HYDROcodone-acetaminophen (NORCO/VICODIN) 5-325 MG tablet hydrocodone 5 mg-acetaminophen 325 mg tablet (Patient not taking: Reported on 05/27/2022)     rosuvastatin (CRESTOR) 20 MG tablet Take 1 tablet (20 mg total) by mouth daily. (Patient not taking: Reported on 05/27/2022) 90 tablet 3   No facility-administered medications prior to visit.     ROS Review of Systems  Constitutional:  Negative for activity change and appetite change.  HENT:  Negative for sinus pressure and sore throat.   Respiratory:  Negative for chest tightness, shortness of breath and wheezing.   Cardiovascular:  Negative for chest pain and palpitations.  Gastrointestinal:  Negative for abdominal distention, abdominal pain and constipation.  Genitourinary: Negative.   Musculoskeletal: Negative.   Skin:  Positive for rash.  Psychiatric/Behavioral:  Negative for behavioral problems and dysphoric mood.     Objective:  BP (!) 148/85   Pulse (!) 109   Temp 98 F (36.7 C) (Oral)   Ht '4\' 11"'$  (1.499 m)   Wt 222 lb (100.7 kg)   SpO2 97%   PF (!) 0 L/min   BMI 44.84 kg/m      05/27/2022    8:48 AM 01/30/2022    9:30 AM 01/30/2022    8:39 AM  BP/Weight  Systolic BP 161 096 045  Diastolic BP 85 93 86  Wt. (Lbs) 222  228.6  BMI 44.84 kg/m2  46.17 kg/m2      Physical Exam Constitutional:      Appearance: She is well-developed. She is obese.  Cardiovascular:     Rate and Rhythm: Tachycardia present.     Heart sounds: Normal heart sounds. No murmur  heard. Pulmonary:     Effort: Pulmonary effort is normal.     Breath sounds: Normal breath sounds. No wheezing or rales.  Chest:     Chest wall: No tenderness.  Abdominal:     General: Bowel sounds are normal. There is no distension.     Palpations: Abdomen is soft. There is no mass.     Tenderness: There is no abdominal tenderness.  Musculoskeletal:        General: Normal range of motion.  Right lower leg: No edema.     Left lower leg: No edema.  Skin:    Comments: Thickened dry patch of skin on lumbar spine  Neurological:     Mental Status: She is alert and oriented to person, place, and time.  Psychiatric:        Mood and Affect: Mood normal.        Latest Ref Rng & Units 02/05/2019   11:12 AM 06/10/2018   10:30 AM 03/17/2012    5:12 PM  CMP  Glucose 70 - 99 mg/dL 84  89  69   BUN 8 - 23 mg/dL '12  6  16   '$ Creatinine 0.44 - 1.00 mg/dL 1.02  0.94  1.02   Sodium 135 - 145 mmol/L 144  145  140   Potassium 3.5 - 5.1 mmol/L 4.0  4.4  4.0   Chloride 98 - 111 mmol/L 109  112  106   CO2 22 - 32 mmol/L '28  22  22   '$ Calcium 8.9 - 10.3 mg/dL 9.7  9.3  9.7   Total Protein 6.5 - 8.1 g/dL 7.2  6.7  6.8   Total Bilirubin 0.3 - 1.2 mg/dL 0.6  0.6  1.1   Alkaline Phos 38 - 126 U/L 174  150  116   AST 15 - 41 U/L 33  126  28   ALT 0 - 44 U/L 35  98  35     Lipid Panel     Component Value Date/Time   CHOL 227 (H) 01/30/2022 1020   TRIG 114 01/30/2022 1020   HDL 48 01/30/2022 1020   CHOLHDL 4.7 (H) 01/30/2022 1020   CHOLHDL 4.3 03/17/2012 1712   VLDL 13 03/17/2012 1712   LDLCALC 159 (H) 01/30/2022 1020    CBC    Component Value Date/Time   WBC 5.1 06/10/2018 1030   RBC 4.46 06/10/2018 1030   HGB 13.5 06/10/2018 1030   HCT 41.0 06/10/2018 1030   PLT 180 06/10/2018 1030   MCV 91.9 06/10/2018 1030   MCH 30.3 06/10/2018 1030   MCHC 32.9 06/10/2018 1030   RDW 12.8 06/10/2018 1030   LYMPHSABS 1.3 06/10/2018 1030   MONOABS 0.5 06/10/2018 1030   EOSABS 0.1 06/10/2018 1030    BASOSABS 0.0 06/10/2018 1030    Lab Results  Component Value Date   HGBA1C 5.8 (A) 01/30/2022    Lab Results  Component Value Date   TSH 1.250 01/30/2022    Assessment & Plan:  1. History of stroke Counseled on the need for secondary prevention including statin therapy and adequate blood pressure control but she is not open to resuming her medications and would like to work on lifestyle Discussed the implications of her actions.  2. Vitamin D deficiency - VITAMIN D 25 Hydroxy (Vit-D Deficiency, Fractures)  3. Hyperlipidemia, unspecified hyperlipidemia type Uncontrolled Not adherent with statin Encouraged to resume statin Low-cholesterol diet  4. Prediabetes Labs reveal prediabetes with an A1c of 5.8.  Working on a low carbohydrate diet, exercise, weight loss is recommended in order to prevent progression to type 2 diabetes mellitus.  - Hemoglobin A1c  5. Thyroid nodule Status post radioactive iodine uptake scan in 2005 Thyroid ultrasound was ordered by the internal medicine clinic but she has not followed through with this - T4, free - TSH - T3  6. Malignant neoplasm of upper-inner quadrant of right breast in female, estrogen receptor positive ([place]) S/p lumpectomy, adjuvant radiation, adjuvant antiestrogen therapy Currently on  Femara Follow-up with oncology  7. Screening for colon cancer - Ambulatory referral to Gastroenterology  8. Primary hypertension Uncontrolled She declines taking her antihypertensive despite being encouraged to do so Counseled on blood pressure goal of less than 130/80, low-sodium, DASH diet, medication compliance, 150 minutes of moderate intensity exercise per week. Discussed medication compliance, adverse effects. - LP+Non-HDL Cholesterol - CMP14+EGFR  9. Rash and nonspecific skin eruption - triamcinolone cream (KENALOG) 0.1 %; Apply 1 Application topically 2 (two) times daily.  Dispense: 45 g; Refill: 1    Meds ordered this  encounter  Medications   DISCONTD: triamcinolone cream (KENALOG) 0.1 %    Sig: Apply 1 Application topically 2 (two) times daily.    Dispense:  45 g    Refill:  1   triamcinolone cream (KENALOG) 0.1 %    Sig: Apply 1 Application topically 2 (two) times daily.    Dispense:  45 g    Refill:  1    Follow-up: Return in about 3 months (around 08/26/2022) for Chronic medical conditions.       Charlott Rakes, MD, FAAFP. [facility] and [facility] [place], [place]   05/27/2022, 5:49 PM

## 2022-05-27 NOTE — Patient Instructions (Signed)

## 2022-05-27 NOTE — Progress Notes (Signed)
Sleeping too much Discuss amlodipine having side effects Not taking Rosuvastatin Discuss medication refills

## 2022-05-28 ENCOUNTER — Other Ambulatory Visit: Payer: Self-pay | Admitting: Family Medicine

## 2022-05-28 DIAGNOSIS — R748 Abnormal levels of other serum enzymes: Secondary | ICD-10-CM

## 2022-05-28 LAB — LP+NON-HDL CHOLESTEROL
Cholesterol, Total: 250 mg/dL — ABNORMAL HIGH (ref 100–199)
HDL: 52 mg/dL (ref >39)
LDL Chol Calc (NIH): 178 mg/dL — ABNORMAL HIGH (ref 0–99)
Total Non-HDL-Chol (LDL+VLDL): 198 mg/dL — ABNORMAL HIGH (ref 0–129)
Triglycerides: 111 mg/dL (ref 0–149)
VLDL Cholesterol Cal: 20 mg/dL (ref 5–40)

## 2022-05-28 LAB — VITAMIN D 25 HYDROXY (VIT D DEFICIENCY, FRACTURES): Vit D, 25-Hydroxy: 24.9 ng/mL — ABNORMAL LOW (ref 30.0–100.0)

## 2022-05-28 LAB — CMP14+EGFR
ALT: 47 IU/L — ABNORMAL HIGH (ref 0–32)
AST: 48 IU/L — ABNORMAL HIGH (ref 0–40)
Albumin/Globulin Ratio: 1.4 (ref 1.2–2.2)
Albumin: 4.1 g/dL (ref 3.8–4.8)
Alkaline Phosphatase: 161 IU/L — ABNORMAL HIGH (ref 44–121)
BUN/Creatinine Ratio: 15 (ref 12–28)
BUN: 16 mg/dL (ref 8–27)
Bilirubin Total: 0.5 mg/dL (ref 0.0–1.2)
CO2: 21 mmol/L (ref 20–29)
Calcium: 10 mg/dL (ref 8.7–10.3)
Chloride: 106 mmol/L (ref 96–106)
Creatinine, Ser: 1.06 mg/dL — ABNORMAL HIGH (ref 0.57–1.00)
Globulin, Total: 3 g/dL (ref 1.5–4.5)
Glucose: 107 mg/dL — ABNORMAL HIGH (ref 70–99)
Potassium: 4.3 mmol/L (ref 3.5–5.2)
Sodium: 146 mmol/L — ABNORMAL HIGH (ref 134–144)
Total Protein: 7.1 g/dL (ref 6.0–8.5)
eGFR: 56 mL/min/{1.73_m2} — ABNORMAL LOW (ref >59)

## 2022-05-28 LAB — HEMOGLOBIN A1C
Est. average glucose Bld gHb Est-mCnc: 123 mg/dL
Hgb A1c MFr Bld: 5.9 % — ABNORMAL HIGH (ref 4.8–5.6)

## 2022-05-28 LAB — TSH: TSH: 2.61 u[IU]/mL (ref 0.450–4.500)

## 2022-05-28 LAB — T3: T3, Total: 162 ng/dL (ref 71–180)

## 2022-05-28 LAB — T4, FREE: Free T4: 1.33 ng/dL (ref 0.82–1.77)

## 2022-05-28 MED ORDER — ERGOCALCIFEROL 1.25 MG (50000 UT) PO CAPS: 50000.0000 [IU] | ORAL_CAPSULE | ORAL | 0 refills | Status: DC

## 2022-05-30 ENCOUNTER — Telehealth: Payer: Self-pay | Admitting: *Deleted

## 2022-05-30 DIAGNOSIS — R21 Rash and other nonspecific skin eruption: Secondary | ICD-10-CM

## 2022-05-30 MED ORDER — TRIAMCINOLONE ACETONIDE 0.1 % EX CREA: 1.0000 | TOPICAL_CREAM | Freq: Two times a day (BID) | CUTANEOUS | 1 refills | Status: DC

## 2022-05-30 MED ORDER — ERGOCALCIFEROL 1.25 MG (50000 UT) PO CAPS: 50000.0000 [IU] | ORAL_CAPSULE | ORAL | 0 refills | Status: DC

## 2022-05-30 NOTE — Telephone Encounter (Signed)
Patient states her medication was sent to the incorrect pharmacy- she request [facility].

## 2022-06-14 ENCOUNTER — Other Ambulatory Visit: Payer: Self-pay | Admitting: Pharmacist

## 2022-06-14 ENCOUNTER — Ambulatory Visit: Payer: Medicare PPO | Attending: Family Medicine

## 2022-06-14 DIAGNOSIS — R748 Abnormal levels of other serum enzymes: Secondary | ICD-10-CM

## 2022-06-14 DIAGNOSIS — R21 Rash and other nonspecific skin eruption: Secondary | ICD-10-CM

## 2022-06-14 MED ORDER — ERGOCALCIFEROL 1.25 MG (50000 UT) PO CAPS: 50000.0000 [IU] | ORAL_CAPSULE | ORAL | 0 refills | Status: DC

## 2022-06-14 MED ORDER — TRIAMCINOLONE ACETONIDE 0.1 % EX CREA: 1.0000 | TOPICAL_CREAM | Freq: Two times a day (BID) | CUTANEOUS | 1 refills | Status: DC

## 2022-06-15 LAB — HEPATIC FUNCTION PANEL
ALT: 28 IU/L (ref 0–32)
AST: 37 IU/L (ref 0–40)
Albumin: 4 g/dL (ref 3.8–4.8)
Alkaline Phosphatase: 147 IU/L — ABNORMAL HIGH (ref 44–121)
Bilirubin Total: 0.5 mg/dL (ref 0.0–1.2)
Bilirubin, Direct: 0.15 mg/dL (ref 0.00–0.40)
Total Protein: 6.7 g/dL (ref 6.0–8.5)

## 2022-07-04 ENCOUNTER — Telehealth: Payer: Self-pay

## 2022-07-04 NOTE — Telephone Encounter (Signed)
Pt given lab results per notes of Dr. Margarita Rana on 2/29(24. Pt verbalized understanding. Please send a hard copy of lab work.

## 2022-07-04 NOTE — Telephone Encounter (Signed)
Noted  

## 2022-08-19 DIAGNOSIS — Z124 Encounter for screening for malignant neoplasm of cervix: Secondary | ICD-10-CM | POA: Diagnosis not present

## 2022-08-19 DIAGNOSIS — Z01419 Encounter for gynecological examination (general) (routine) without abnormal findings: Secondary | ICD-10-CM | POA: Diagnosis not present

## 2022-08-19 DIAGNOSIS — Z13820 Encounter for screening for osteoporosis: Secondary | ICD-10-CM | POA: Diagnosis not present

## 2022-08-20 NOTE — Progress Notes (Signed)
Patient Care Team: Hoy Register, MD as PCP - General (Family Medicine) Serena Croissant, MD as Consulting Physician (Hematology and Oncology) Lonie Peak, MD as Attending Physician (Radiation Oncology) Claud Kelp, MD as Consulting Physician (General Surgery) Jaymes Graff, MD as Consulting Physician (Obstetrics and Gynecology)  DIAGNOSIS: No diagnosis found.  SUMMARY OF ONCOLOGIC HISTORY: Oncology History  Malignant neoplasm of upper-inner quadrant of right breast in female, estrogen receptor positive  04/27/2018 Initial Diagnosis   Screening mammogram detected right breast masses upper inner quadrant 7 mm and 4 mm, biopsy revealed grade 1 IDC ER 100%, PR 100%, HER-2 negative IHC 0, Ki-67 12%, T1BN0 stage Ia clinical stage   06/12/2018 Surgery   Right lumpectomy: 0.3 cm IDC with extracellular mucin, grade 1, with intermediate grade DCIS, margins negative, negative for lymphovascular or perineural invasion, 0/1 lymph node negative, ER 100%, PR 100%, HER-2 negative IHC 0, Ki-67 12%, T1 a N0 stage Ia   06/30/2018 Cancer Staging   Staging form: Breast, AJCC 8th Edition - Pathologic: Stage IA (pT1a, pN0, cM0, G1, ER+, PR+, HER2-) - Signed by Lonie Peak, MD on 06/30/2018   07/21/2018 - 08/10/2018 Radiation Therapy   Adjuvant radiation   08/2018 -  Anti-estrogen oral therapy   Anastrozole daily     CHIEF COMPLIANT:   INTERVAL HISTORY: Caitlyn Mccoy is a   ALLERGIES:  is allergic to penicillins and sulfa antibiotics.  MEDICATIONS:  Current Outpatient Medications  Medication Sig Dispense Refill   acetaminophen (TYLENOL) 325 MG tablet Take 650 mg by mouth every 6 (six) hours as needed.     amLODipine (NORVASC) 5 MG tablet Take 1 tablet (5 mg total) by mouth daily. (Patient not taking: Reported on 05/27/2022) 90 tablet 3   ergocalciferol (DRISDOL) 1.25 MG (50000 UT) capsule Take 1 capsule (50,000 Units total) by mouth once a week. 12 capsule 0   fluticasone (FLONASE) 50  MCG/ACT nasal spray      HYDROcodone-acetaminophen (NORCO/VICODIN) 5-325 MG tablet hydrocodone 5 mg-acetaminophen 325 mg tablet (Patient not taking: Reported on 05/27/2022)     letrozole (FEMARA) 2.5 MG tablet Take 1 tablet (2.5 mg total) by mouth daily. 90 tablet 3   rosuvastatin (CRESTOR) 20 MG tablet Take 1 tablet (20 mg total) by mouth daily. (Patient not taking: Reported on 05/27/2022) 90 tablet 3   triamcinolone cream (KENALOG) 0.1 % Apply 1 Application topically 2 (two) times daily. 45 g 1   No current facility-administered medications for this visit.    PHYSICAL EXAMINATION: ECOG PERFORMANCE STATUS: {CHL ONC ECOG PS:[phone]}  There were no vitals filed for this visit. There were no vitals filed for this visit.  BREAST:*** No palpable masses or nodules in either right or left breasts. No palpable axillary supraclavicular or infraclavicular adenopathy no breast tenderness or nipple discharge. (exam performed in the presence of a chaperone)  LABORATORY DATA:  I have reviewed the data as listed    Latest Ref Rng & Units 06/14/2022    8:33 AM 05/27/2022    9:40 AM 02/05/2019   11:12 AM  CMP  Glucose 70 - 99 mg/dL  161  84   BUN 8 - 27 mg/dL  16  12   Creatinine 0.96 - 1.00 mg/dL  0.45  4.09   Sodium 811 - 144 mmol/L  146  144   Potassium 3.5 - 5.2 mmol/L  4.3  4.0   Chloride 96 - 106 mmol/L  106  109   CO2 20 - 29 mmol/L  21  28   Calcium 8.7 - 10.3 mg/dL  16.1  9.7   Total Protein 6.0 - 8.5 g/dL 6.7  7.1  7.2   Total Bilirubin 0.0 - 1.2 mg/dL 0.5  0.5  0.6   Alkaline Phos 44 - 121 IU/L 147  161  174   AST 0 - 40 IU/L 37  48  33   ALT 0 - 32 IU/L 28  47  35     Lab Results  Component Value Date   WBC 5.1 06/10/2018   HGB 13.5 06/10/2018   HCT 41.0 06/10/2018   MCV 91.9 06/10/2018   PLT 180 06/10/2018   NEUTROABS 3.2 06/10/2018    ASSESSMENT & PLAN:  No problem-specific Assessment & Plan notes found for this encounter.    No orders of the defined types were  placed in this encounter.  The patient has a good understanding of the overall plan. she agrees with it. she will call with any problems that may develop before the next visit here. Total time spent: 30 mins including face to face time and time spent for planning, charting and co-ordination of care   Sherlyn Lick, CMA 08/20/22    I Janan Ridge am acting as a Neurosurgeon for [employer]  ***

## 2022-08-21 ENCOUNTER — Other Ambulatory Visit: Payer: Self-pay | Admitting: Obstetrics and Gynecology

## 2022-08-21 DIAGNOSIS — Z13820 Encounter for screening for osteoporosis: Secondary | ICD-10-CM

## 2022-08-22 ENCOUNTER — Other Ambulatory Visit: Payer: Self-pay

## 2022-08-22 ENCOUNTER — Inpatient Hospital Stay: Payer: Medicare PPO | Attending: Hematology and Oncology | Admitting: Hematology and Oncology

## 2022-08-22 VITALS — BP 153/60 | HR 95 | Temp 97.5°F | Resp 18 | Ht 59.0 in | Wt 206.3 lb

## 2022-08-22 DIAGNOSIS — Z923 Personal history of irradiation: Secondary | ICD-10-CM | POA: Insufficient documentation

## 2022-08-22 DIAGNOSIS — Z170 Estrogen receptor positive status [ER+]: Secondary | ICD-10-CM | POA: Insufficient documentation

## 2022-08-22 DIAGNOSIS — Z79899 Other long term (current) drug therapy: Secondary | ICD-10-CM | POA: Diagnosis not present

## 2022-08-22 DIAGNOSIS — Z79811 Long term (current) use of aromatase inhibitors: Secondary | ICD-10-CM | POA: Insufficient documentation

## 2022-08-22 DIAGNOSIS — C50211 Malignant neoplasm of upper-inner quadrant of right female breast: Secondary | ICD-10-CM | POA: Diagnosis not present

## 2022-08-22 NOTE — Assessment & Plan Note (Addendum)
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  Adjuvant radiation therapy 07/21/2018-08/10/2018 2.  Follow-up adjuvant antiestrogen therapy with anastrozole started 08/19/2018 stopped April 2021, switched to letrozole 09/12/20    Breast cancer surveillance 1.  Breast exam 08/22/2022: Benign 2.  Mammogram 05/08/2022 benign breast density category B   I sent a prescription for bath lift as well as for bras. Return to clinic in 1 year for follow-up

## 2022-08-28 ENCOUNTER — Encounter: Payer: Self-pay | Admitting: Family Medicine

## 2022-08-28 ENCOUNTER — Ambulatory Visit: Payer: Medicare PPO | Attending: Family Medicine | Admitting: Family Medicine

## 2022-08-28 VITALS — BP 138/83 | HR 74 | Ht 59.0 in | Wt 207.6 lb

## 2022-08-28 DIAGNOSIS — I10 Essential (primary) hypertension: Secondary | ICD-10-CM | POA: Diagnosis not present

## 2022-08-28 DIAGNOSIS — R600 Localized edema: Secondary | ICD-10-CM | POA: Diagnosis not present

## 2022-08-28 DIAGNOSIS — Z8673 Personal history of transient ischemic attack (TIA), and cerebral infarction without residual deficits: Secondary | ICD-10-CM

## 2022-08-28 DIAGNOSIS — Z170 Estrogen receptor positive status [ER+]: Secondary | ICD-10-CM

## 2022-08-28 DIAGNOSIS — E785 Hyperlipidemia, unspecified: Secondary | ICD-10-CM

## 2022-08-28 DIAGNOSIS — C50211 Malignant neoplasm of upper-inner quadrant of right female breast: Secondary | ICD-10-CM

## 2022-08-28 NOTE — Progress Notes (Signed)
Subjective:  Patient ID: Caitlyn Mccoy, female    DOB: March 15, 1951  Age: 72 y.o. MRN: [id number]  CC: Hypertension   HPI Caitlyn Mccoy is a 72 y.o. year old female with a history of previous CVA, hypertension, hyperlipidemia, Right breast ca ( diagnosed 2020 s/p lumpectomy, adjuvant radiation, adjuvant antiestrogen therapy) on Femara , left eye visual loss, thyroid nodule (radioactive uptake scan in 2005)  Interval History:  She has lost 14 lbs since her last visit 3 months ago. She states she is adherent with her Amlodipine and Crestor and has no problems with her medications. She walks for exercise.  Last visit with her oncologist was 6 days ago Past Medical History:  Diagnosis Date   Blindness    left eye since birth   Breast cancer 06/2018   right breast ca   Breast cancer of upper-inner quadrant of right female breast 06/12/2018   GERD (gastroesophageal reflux disease)    Hiatal hernia    History of cervical dysplasia    in her 24s, underwent partial cervix removal and cryosurgery   Hypertension    Stroke 2000   denies deficits   Yeast infection     Past Surgical History:  Procedure Laterality Date   BREAST LUMPECTOMY Right 06/2018   BREAST LUMPECTOMY WITH RADIOACTIVE SEED AND SENTINEL LYMPH NODE BIOPSY Right 06/12/2018   Procedure: RIGHT BREAST LUMPECTOMY WITH RADIOACTIVE SEED AND  RIGHT AXILLARY  DEEP SENTINEL LYMPH NODE BIOPSY WITH BLUE DYE INJECTION ERAS PATHWAY;  Surgeon: Claud Kelp, MD;  Location: [facility];  Service: General;  Laterality: Right;  PECTORAL BLOCK   CAROTID ENDARTERECTOMY  2001   TONSILLECTOMY     WISDOM TOOTH EXTRACTION      Family History  Problem Relation Age of Onset   Diabetes Mother    Heart attack Father    Breast cancer Neg Hx     Social History   Socioeconomic History   Marital status: Single    Spouse name: Not on file   Number of children: Not on file   Years of education: Not on file   Highest  education level: Not on file  Occupational History   Not on file  Tobacco Use   Smoking status: Former    Packs/day: 0.50    Years: 20.00    Additional pack years: 0.00    Total pack years: 10.00    Types: Cigarettes    Quit date: 06/30/1989    Years since quitting: 33.1   Smokeless tobacco: Never  Vaping Use   Vaping Use: Never used  Substance and Sexual Activity   Alcohol use: No   Drug use: No   Sexual activity: Not Currently  Other Topics Concern   Not on file  Social History Narrative   Lives alone in [place], daughter also lives in [place]. Walks with a cane. Retired was a Curator the [employer]   Social Determinants of Corporate investment banker Strain: Not on file  Food Insecurity: Not on file  Transportation Needs: Unmet Transportation Needs (06/30/2018)   PRAPARE - Administrator, Civil Service (Medical): Yes    Lack of Transportation (Non-Medical): No  Physical Activity: Not on file  Stress: Not on file  Social Connections: Not on file    Allergies  Allergen Reactions   Penicillins    Sulfa Antibiotics     Outpatient Medications Prior to Visit  Medication Sig Dispense Refill   acetaminophen (TYLENOL) 325  MG tablet Take 650 mg by mouth every 6 (six) hours as needed.     amLODipine (NORVASC) 5 MG tablet Take 1 tablet (5 mg total) by mouth daily. 90 tablet 3   ergocalciferol (DRISDOL) 1.25 MG (50000 UT) capsule Take 1 capsule (50,000 Units total) by mouth once a week. 12 capsule 0   fluticasone (FLONASE) 50 MCG/ACT nasal spray      letrozole (FEMARA) 2.5 MG tablet Take 1 tablet (2.5 mg total) by mouth daily. 90 tablet 3   triamcinolone cream (KENALOG) 0.1 % Apply 1 Application topically 2 (two) times daily. 45 g 1   HYDROcodone-acetaminophen (NORCO/VICODIN) 5-325 MG tablet hydrocodone 5 mg-acetaminophen 325 mg tablet (Patient not taking: Reported on 05/27/2022)     rosuvastatin (CRESTOR) 20 MG tablet Take 1 tablet (20 mg total)  by mouth daily. (Patient not taking: Reported on 05/27/2022) 90 tablet 3   No facility-administered medications prior to visit.     ROS Review of Systems  Constitutional:  Negative for activity change and appetite change.  HENT:  Negative for sinus pressure and sore throat.   Respiratory:  Negative for chest tightness, shortness of breath and wheezing.   Cardiovascular:  Negative for chest pain and palpitations.  Gastrointestinal:  Negative for abdominal distention, abdominal pain and constipation.  Genitourinary: Negative.   Musculoskeletal: Negative.   Psychiatric/Behavioral:  Negative for behavioral problems and dysphoric mood.     Objective:  BP 138/83   Pulse 74   Ht  (1.499 m)   Wt 207 lb 9.6 oz (94.2 kg)   SpO2 99%   BMI 41.93 kg/m      08/28/2022   10:20 AM 08/22/2022   10:56 AM 05/27/2022    8:48 AM  BP/Weight  Systolic BP 138 153 148  Diastolic BP 83 60 85  Wt. (Lbs) 207.6 206.3 222  BMI 41.93 kg/m2 41.67 kg/m2 44.84 kg/m2      Physical Exam Constitutional:      Appearance: She is well-developed.  Cardiovascular:     Rate and Rhythm: Normal rate.     Heart sounds: Normal heart sounds. No murmur heard. Pulmonary:     Effort: Pulmonary effort is normal.     Breath sounds: Normal breath sounds. No wheezing or rales.  Chest:     Chest wall: No tenderness.  Abdominal:     General: Bowel sounds are normal. There is no distension.     Palpations: Abdomen is soft. There is no mass.     Tenderness: There is no abdominal tenderness.  Musculoskeletal:        General: Normal range of motion.     Right lower leg: Edema present.     Left lower leg: Edema present.  Neurological:     Mental Status: She is alert and oriented to person, place, and time.  Psychiatric:        Mood and Affect: Mood normal.        Latest Ref Rng & Units 06/14/2022    8:33 AM 05/27/2022    9:40 AM 02/05/2019   11:12 AM  CMP  Glucose 70 - 99 mg/dL  161  84   BUN 8 - 27 mg/dL   16  12   Creatinine 0.96 - 1.00 mg/dL  0.45  4.09   Sodium 811 - 144 mmol/L  146  144   Potassium 3.5 - 5.2 mmol/L  4.3  4.0   Chloride 96 - 106 mmol/L  106  109   CO2  20 - 29 mmol/L  21  28   Calcium 8.7 - 10.3 mg/dL  16.1  9.7   Total Protein 6.0 - 8.5 g/dL 6.7  7.1  7.2   Total Bilirubin 0.0 - 1.2 mg/dL 0.5  0.5  0.6   Alkaline Phos 44 - 121 IU/L 147  161  174   AST 0 - 40 IU/L 37  48  33   ALT 0 - 32 IU/L 28  47  35     Lipid Panel     Component Value Date/Time   CHOL 250 (H) 05/27/2022 0940   TRIG 111 05/27/2022 0940   HDL 52 05/27/2022 0940   CHOLHDL 4.7 (H) 01/30/2022 1020   CHOLHDL 4.3 03/17/2012 1712   VLDL 13 03/17/2012 1712   LDLCALC 178 (H) 05/27/2022 0940    CBC    Component Value Date/Time   WBC 5.1 06/10/2018 1030   RBC 4.46 06/10/2018 1030   HGB 13.5 06/10/2018 1030   HCT 41.0 06/10/2018 1030   PLT 180 06/10/2018 1030   MCV 91.9 06/10/2018 1030   MCH 30.3 06/10/2018 1030   MCHC 32.9 06/10/2018 1030   RDW 12.8 06/10/2018 1030   LYMPHSABS 1.3 06/10/2018 1030   MONOABS 0.5 06/10/2018 1030   EOSABS 0.1 06/10/2018 1030   BASOSABS 0.0 06/10/2018 1030    Lab Results  Component Value Date   HGBA1C 5.9 (H) 05/27/2022    Assessment & Plan:  1. Hyperlipidemia, unspecified hyperlipidemia type Uncontrolled from last set of labs She has since restarted Crestor Will check lipid panel again today Low-cholesterol diet  2. History of stroke Stable Secondary risk factor modification - LP+Non-HDL Cholesterol  3. Malignant neoplasm of upper-inner quadrant of right breast in female, estrogen receptor positive S/p lumpectomy, adjuvant radiation, adjuvant antiestrogen therapy on Femara Mammogram from 05/2022 revealed no mammographic evidence of malignancy Under the care of oncology Continue to follow-up with oncology  4. Primary hypertension Controlled Continue amlodipine Counseled on blood pressure goal of less than 130/80, low-sodium, DASH diet,  medication compliance, 150 minutes of moderate intensity exercise per week. Discussed medication compliance, adverse effects. - CMP14+EGFR  5. Pedal edema Likely dependent edema Encouraged to comply with a low-sodium diet, elevate feet, use compression stockings    Health Care Maintenance: Due for Colonoscopy in 2027 per patient No orders of the defined types were placed in this encounter.   Follow-up: Return in about 6 months (around 02/27/2023) for Chronic medical conditions.       Hoy Register, MD, FAAFP. [facility] [place], [place] [phone]   08/28/2022, 11:20 AM

## 2022-08-28 NOTE — Patient Instructions (Signed)
Exercising to Stay Healthy To become healthy and stay healthy, it is recommended that you do moderate-intensity and vigorous-intensity exercise. You can tell that you are exercising at a moderate intensity if your heart starts beating faster and you start breathing faster but can still hold a conversation. You can tell that you are exercising at a vigorous intensity if you are breathing much harder and faster and cannot hold a conversation while exercising. How can exercise benefit me? Exercising regularly is important. It has many health benefits, such as: Improving overall fitness, flexibility, and endurance. Increasing bone density. Helping with weight control. Decreasing body fat. Increasing muscle strength and endurance. Reducing stress and tension, anxiety, depression, or anger. Improving overall health. What guidelines should I follow while exercising? Before you start a new exercise program, talk with your health care provider. Do not exercise so much that you hurt yourself, feel dizzy, or get very short of breath. Wear comfortable clothes and wear shoes with good support. Drink plenty of water while you exercise to prevent dehydration or heat stroke. Work out until your breathing and your heartbeat get faster (moderate intensity). How often should I exercise? Choose an activity that you enjoy, and set realistic goals. Your health care provider can help you make an activity plan that is individually designed and works best for you. Exercise regularly as told by your health care provider. This may include: Doing strength training two times a week, such as: Lifting weights. Using resistance bands. Push-ups. Sit-ups. Yoga. Doing a certain intensity of exercise for a given amount of time. Choose from these options: A total of 150 minutes of moderate-intensity exercise every week. A total of 75 minutes of vigorous-intensity exercise every week. A mix of moderate-intensity and  vigorous-intensity exercise every week. Children, pregnant women, people who have not exercised regularly, people who are overweight, and older adults may need to talk with a health care provider about what activities are safe to perform. If you have a medical condition, be sure to talk with your health care provider before you start a new exercise program. What are some exercise ideas? Moderate-intensity exercise ideas include: Walking 1 mile (1.6 km) in about 15 minutes. Biking. Hiking. Golfing. Dancing. Water aerobics. Vigorous-intensity exercise ideas include: Walking 4.5 miles (7.2 km) or more in about 1 hour. Jogging or running 5 miles (8 km) in about 1 hour. Biking 10 miles (16.1 km) or more in about 1 hour. Lap swimming. Roller-skating or in-line skating. Cross-country skiing. Vigorous competitive sports, such as football, basketball, and soccer. Jumping rope. Aerobic dancing. What are some everyday activities that can help me get exercise? Yard work, such as: Pushing a lawn mower. Raking and bagging leaves. Washing your car. Pushing a stroller. Shoveling snow. Gardening. Washing windows or floors. How can I be more active in my day-to-day activities? Use stairs instead of an elevator. Take a walk during your lunch break. If you drive, park your car farther away from your work or school. If you take public transportation, get off one stop early and walk the rest of the way. Stand up or walk around during all of your indoor phone calls. Get up, stretch, and walk around every 30 minutes throughout the day. Enjoy exercise with a friend. Support to continue exercising will help you keep a regular routine of activity. Where to find more information You can find more information about exercising to stay healthy from: U.S. Department of Health and Human Services: www.hhs.gov Centers for Disease Control and Prevention (  CDC): www.cdc.gov Summary Exercising regularly is  important. It will improve your overall fitness, flexibility, and endurance. Regular exercise will also improve your overall health. It can help you control your weight, reduce stress, and improve your bone density. Do not exercise so much that you hurt yourself, feel dizzy, or get very short of breath. Before you start a new exercise program, talk with your health care provider. This information is not intended to replace advice given to you by your health care provider. Make sure you discuss any questions you have with your health care provider. Document Revised: 08/18/2020 Document Reviewed: 08/18/2020 Elsevier Patient Education  2023 Elsevier Inc.  

## 2022-08-29 LAB — LP+NON-HDL CHOLESTEROL
Cholesterol, Total: 203 mg/dL — ABNORMAL HIGH (ref 100–199)
HDL: 46 mg/dL (ref >39)
LDL Chol Calc (NIH): 139 mg/dL — ABNORMAL HIGH (ref 0–99)
Total Non-HDL-Chol (LDL+VLDL): 157 mg/dL — ABNORMAL HIGH (ref 0–129)
Triglycerides: 100 mg/dL (ref 0–149)
VLDL Cholesterol Cal: 18 mg/dL (ref 5–40)

## 2022-08-29 LAB — CMP14+EGFR
ALT: 25 IU/L (ref 0–32)
AST: 34 IU/L (ref 0–40)
Albumin/Globulin Ratio: 1.4 (ref 1.2–2.2)
Albumin: 4 g/dL (ref 3.8–4.8)
Alkaline Phosphatase: 142 IU/L — ABNORMAL HIGH (ref 44–121)
BUN/Creatinine Ratio: 10 — ABNORMAL LOW (ref 12–28)
BUN: 10 mg/dL (ref 8–27)
Bilirubin Total: 0.5 mg/dL (ref 0.0–1.2)
CO2: 23 mmol/L (ref 20–29)
Calcium: 9.9 mg/dL (ref 8.7–10.3)
Chloride: 106 mmol/L (ref 96–106)
Creatinine, Ser: 1.05 mg/dL — ABNORMAL HIGH (ref 0.57–1.00)
Globulin, Total: 2.8 g/dL (ref 1.5–4.5)
Glucose: 91 mg/dL (ref 70–99)
Potassium: 3.6 mmol/L (ref 3.5–5.2)
Sodium: 145 mmol/L — ABNORMAL HIGH (ref 134–144)
Total Protein: 6.8 g/dL (ref 6.0–8.5)
eGFR: 56 mL/min/{1.73_m2} — ABNORMAL LOW (ref >59)

## 2022-08-30 ENCOUNTER — Ambulatory Visit: Payer: Medicare PPO | Attending: Family Medicine

## 2022-08-30 ENCOUNTER — Telehealth: Payer: Self-pay

## 2022-08-30 ENCOUNTER — Ambulatory Visit: Payer: Self-pay

## 2022-08-30 ENCOUNTER — Other Ambulatory Visit: Payer: Self-pay | Admitting: Hematology and Oncology

## 2022-08-30 DIAGNOSIS — Z23 Encounter for immunization: Secondary | ICD-10-CM | POA: Diagnosis not present

## 2022-08-30 NOTE — Telephone Encounter (Signed)
Pt given lab results per notes of Dr. Alvis Lemmings on 08/30/22. Pt verbalized understanding.   Hello, Your cholesterol is slightly elevated but has improved compared to previous labs.  Please continue with your Crestor and a low-cholesterol diet and we will recheck levels at your next visit.  Other labs are stable. -Dr.Newlin.  Written by Hoy Register, MD on 08/29/2022  6:39 PM EDT

## 2022-08-30 NOTE — Progress Notes (Signed)
TDAP vaccine administered in left deltoid per protocols.  Information sheet given. Patient denies and pain or discomfort at injection site. Tolerated injection well no reaction.  

## 2022-09-13 DIAGNOSIS — C50211 Malignant neoplasm of upper-inner quadrant of right female breast: Secondary | ICD-10-CM | POA: Diagnosis not present

## 2022-09-26 DIAGNOSIS — C50211 Malignant neoplasm of upper-inner quadrant of right female breast: Secondary | ICD-10-CM | POA: Diagnosis not present

## 2022-10-25 ENCOUNTER — Telehealth: Payer: Self-pay

## 2022-10-25 NOTE — Telephone Encounter (Signed)
Returned Pt's call regarding medical supply store in [place]. Per Pt, Dr. Pamelia Hoit gave her a written prescription for a bath lift at most recent office visit. Pt called to ask name of the medical supply store in [facility]. Office note did not specify, [facility] [phone] given to Pt. Advised Pt to call and inquire if bath lift available. Pt verbalized understanding. Pt also states she has a Animal nutritionist for nurse Adelina Mings and would like to know how to send it. Offered for Pt to call back on Monday or bring in office. Pt verbalized understanding.

## 2022-11-11 ENCOUNTER — Other Ambulatory Visit: Payer: Self-pay | Admitting: Family Medicine

## 2022-11-11 DIAGNOSIS — R21 Rash and other nonspecific skin eruption: Secondary | ICD-10-CM

## 2023-01-23 ENCOUNTER — Other Ambulatory Visit: Payer: Self-pay | Admitting: Hematology and Oncology

## 2023-01-23 DIAGNOSIS — Z1231 Encounter for screening mammogram for malignant neoplasm of breast: Secondary | ICD-10-CM

## 2023-02-27 ENCOUNTER — Ambulatory Visit: Payer: Medicare PPO | Admitting: Family Medicine

## 2023-03-20 DIAGNOSIS — C50211 Malignant neoplasm of upper-inner quadrant of right female breast: Secondary | ICD-10-CM | POA: Diagnosis not present

## 2023-03-26 ENCOUNTER — Other Ambulatory Visit (HOSPITAL_BASED_OUTPATIENT_CLINIC_OR_DEPARTMENT_OTHER): Payer: Medicare PPO | Admitting: Pharmacist

## 2023-03-26 DIAGNOSIS — E7800 Pure hypercholesterolemia, unspecified: Secondary | ICD-10-CM

## 2023-03-26 NOTE — Progress Notes (Signed)
Patient ID: Caitlyn Mccoy, female   DOB: [birth year], 72 y.o.   MRN: [id number]  Pharmacy Quality Measure Review  This patient is appearing on a report for being at risk of failing the adherence measure for cholesterol (statin) medications this calendar year.   Medication: rosuvastatin Last fill date: 01/24/2023 for 90 day supply. Abs fail date on this report 04/17/23 based on last fill date of 11/12/2022.  Insurance report was not up to date. No action needed at this time.   Butch Penny, PharmD, P[place]y [place], CPP Clinical Pharmacist [facility] [phone]

## 2023-04-02 ENCOUNTER — Other Ambulatory Visit: Payer: Self-pay | Admitting: Student

## 2023-05-13 ENCOUNTER — Ambulatory Visit: Payer: Medicare PPO | Attending: Family Medicine | Admitting: Family Medicine

## 2023-05-13 ENCOUNTER — Encounter: Payer: Self-pay | Admitting: Family Medicine

## 2023-05-13 VITALS — BP 165/91 | HR 89 | Ht 59.0 in | Wt 205.8 lb

## 2023-05-13 DIAGNOSIS — R7303 Prediabetes: Secondary | ICD-10-CM | POA: Diagnosis not present

## 2023-05-13 DIAGNOSIS — I10 Essential (primary) hypertension: Secondary | ICD-10-CM

## 2023-05-13 DIAGNOSIS — E785 Hyperlipidemia, unspecified: Secondary | ICD-10-CM

## 2023-05-13 DIAGNOSIS — C50211 Malignant neoplasm of upper-inner quadrant of right female breast: Secondary | ICD-10-CM

## 2023-05-13 DIAGNOSIS — Z170 Estrogen receptor positive status [ER+]: Secondary | ICD-10-CM

## 2023-05-13 LAB — POCT GLYCOSYLATED HEMOGLOBIN (HGB A1C): HbA1c, POC (prediabetic range): 5.7 % (ref 5.7–6.4)

## 2023-05-13 NOTE — Progress Notes (Addendum)
 Subjective:  Patient ID: Caitlyn Mccoy, female    DOB: November 01, 1950  Age: 73 y.o. MRN: [id number]  CC: Medical Management of Chronic Issues   HPI Caitlyn Mccoy is a 73 y.o. year old female with a history of previous CVA, hypertension, hyperlipidemia, Right breast ca ( diagnosed 2020 s/p lumpectomy, adjuvant radiation, adjuvant antiestrogen therapy) on Femara  , left eye visual loss, thyroid  nodule (radioactive uptake scan in 2005)    Interval History: Discussed the use of AI scribe software for clinical note transcription with the patient, who gave verbal consent to proceed.  She presents with excessive sleepiness. She reports sleeping 'around the clock' and struggles to stay awake during the day. She has minimal physical activity, walking about seven minutes a day to the mailbox. She has not been driving recently and spends most of her time at home. She plans to enroll at a senior resource center in the spring for more activity.  The patient's blood pressure was noted to be elevated during the visit, but she reports adherence to her amlodipine  regimen. She also takes atorvastatin for hyperlipidemia and letrozole  for breast cancer. This will mark five years since her breast cancer surgery and she has an upcoming appointment for mammogram and subsequently a visit with her oncologist.  She reports a small amount of bladder leakage but denies constipation.         Past Medical History:  Diagnosis Date   Blindness    left eye since birth   Breast cancer (HCC) 06/2018   right breast ca   Breast cancer of upper-inner quadrant of right female breast (HCC) 06/12/2018   GERD (gastroesophageal reflux disease)    Hiatal hernia    History of cervical dysplasia    in her 91s, underwent partial cervix removal and cryosurgery   Hypertension    Stroke [facility]) 2000   denies deficits   Yeast infection     Past Surgical History:  Procedure Laterality Date   BREAST LUMPECTOMY Right 06/2018    BREAST LUMPECTOMY WITH RADIOACTIVE SEED AND SENTINEL LYMPH NODE BIOPSY Right 06/12/2018   Procedure: RIGHT BREAST LUMPECTOMY WITH RADIOACTIVE SEED AND  RIGHT AXILLARY  DEEP SENTINEL LYMPH NODE BIOPSY WITH BLUE DYE INJECTION ERAS PATHWAY;  Surgeon: Gail Favorite, MD;  Location: [facility];  Service: General;  Laterality: Right;  PECTORAL BLOCK   CAROTID ENDARTERECTOMY  2001   TONSILLECTOMY     WISDOM TOOTH EXTRACTION      Family History  Problem Relation Age of Onset   Diabetes Mother    Heart attack Father    Breast cancer Neg Hx     Social History   Socioeconomic History   Marital status: Single    Spouse name: Not on file   Number of children: Not on file   Years of education: Not on file   Highest education level: Not on file  Occupational History   Not on file  Tobacco Use   Smoking status: Former    Current packs/day: 0.00    Average packs/day: 0.5 packs/day for 20.0 years (10.0 ttl pk-yrs)    Types: Cigarettes    Start date: 06/30/1969    Quit date: 06/30/1989    Years since quitting: 33.8   Smokeless tobacco: Never  Vaping Use   Vaping status: Never Used  Substance and Sexual Activity   Alcohol use: No   Drug use: No   Sexual activity: Not Currently  Other Topics Concern   Not on file  Social History Narrative   Lives alone in [place], daughter also lives in [place]. Walks with a cane. Retired was a curator the [employer]   Social Drivers of Corporate Investment Banker Strain: Low Risk  (05/13/2023)   Overall Financial Resource Strain (CARDIA)    Difficulty of Paying Living Expenses: Not very hard  Food Insecurity: Food Insecurity Present (05/13/2023)   Hunger Vital Sign    Worried About Running Out of Food in the Last Year: Sometimes true    Ran Out of Food in the Last Year: Never true  Transportation Needs: No Transportation Needs (05/13/2023)   PRAPARE - Administrator, Civil Service (Medical): No    Lack of  Transportation (Non-Medical): No  Physical Activity: Insufficiently Active (05/13/2023)   Exercise Vital Sign    Days of Exercise per Week: 7 days    Minutes of Exercise per Session: 10 min  Stress: No Stress Concern Present (05/13/2023)   [employer] of Occupational Health - Occupational Stress Questionnaire    Feeling of Stress : Not at all  Social Connections: Unknown (05/13/2023)   Social Connection and Isolation Panel [NHANES]    Frequency of Communication with Friends and Family: Once a week    Frequency of Social Gatherings with Friends and Family: Not on file    Attends Religious Services: 1 to 4 times per year    Active Member of [employer] or Organizations: Yes    Attends Banker Meetings: 1 to 4 times per year    Marital Status: Widowed    Allergies  Allergen Reactions   Penicillins    Sulfa Antibiotics     Outpatient Medications Prior to Visit  Medication Sig Dispense Refill   letrozole  (FEMARA ) 2.5 MG tablet TAKE 1 TABLET EVERY DAY 90 tablet 3   triamcinolone  cream (KENALOG ) 0.1 % APPLY TO THE AFFECTED AREA(S) TWICE DAILY 45 g 3   amLODipine  (NORVASC ) 5 MG tablet Take 1 tablet (5 mg total) by mouth daily. 90 tablet 3   ergocalciferol  (DRISDOL ) 1.25 MG (50000 UT) capsule Take 1 capsule (50,000 Units total) by mouth once a week. (Patient not taking: Reported on 05/13/2023) 12 capsule 0   rosuvastatin  (CRESTOR ) 20 MG tablet Take 1 tablet (20 mg total) by mouth daily. (Patient not taking: Reported on 05/27/2022) 90 tablet 3   acetaminophen  (TYLENOL ) 325 MG tablet Take 650 mg by mouth every 6 (six) hours as needed. (Patient not taking: Reported on 05/13/2023)     fluticasone (FLONASE) 50 MCG/ACT nasal spray  (Patient not taking: Reported on 05/13/2023)     HYDROcodone -acetaminophen  (NORCO/VICODIN) 5-325 MG tablet hydrocodone  5 mg-acetaminophen  325 mg tablet (Patient not taking: Reported on 05/13/2023)     No facility-administered medications prior to visit.      ROS Review of Systems  Constitutional:  Negative for activity change and appetite change.  HENT:  Negative for sinus pressure and sore throat.   Respiratory:  Negative for chest tightness, shortness of breath and wheezing.   Cardiovascular:  Negative for chest pain and palpitations.  Gastrointestinal:  Negative for abdominal distention, abdominal pain and constipation.  Genitourinary: Negative.   Musculoskeletal: Negative.   Psychiatric/Behavioral:  Negative for behavioral problems and dysphoric mood.     Objective:  BP (!) 165/91   Pulse 89   Ht 4' 11 (1.499 m)   Wt 205 lb 12.8 oz (93.4 kg)   SpO2 100%   BMI 41.57 kg/m      05/13/2023  4:18 PM 08/28/2022   10:20 AM 08/22/2022   10:56 AM  BP/Weight  Systolic BP 165 138 153  Diastolic BP 91 83 60  Wt. (Lbs) 205.8 207.6 206.3  BMI 41.57 kg/m2 41.93 kg/m2 41.67 kg/m2      Physical Exam Constitutional:      Appearance: She is well-developed.  Cardiovascular:     Rate and Rhythm: Normal rate.     Heart sounds: Normal heart sounds. No murmur heard. Pulmonary:     Effort: Pulmonary effort is normal.     Breath sounds: Normal breath sounds. No wheezing or rales.  Chest:     Chest wall: No tenderness.  Abdominal:     General: Bowel sounds are normal. There is no distension.     Palpations: Abdomen is soft. There is no mass.     Tenderness: There is no abdominal tenderness.  Musculoskeletal:        General: Normal range of motion.     Right lower leg: No edema.     Left lower leg: No edema.  Neurological:     Mental Status: She is alert and oriented to person, place, and time.  Psychiatric:        Mood and Affect: Mood normal.        Latest Ref Rng & Units 08/28/2022   11:14 AM 06/14/2022    8:33 AM 05/27/2022    9:40 AM  CMP  Glucose 70 - 99 mg/dL 91   892   BUN 8 - 27 mg/dL 10   16   Creatinine 9.42 - 1.00 mg/dL 8.94   8.93   Sodium 865 - 144 mmol/L 145   146   Potassium 3.5 - 5.2 mmol/L 3.6   4.3    Chloride 96 - 106 mmol/L 106   106   CO2 20 - 29 mmol/L 23   21   Calcium  8.7 - 10.3 mg/dL 9.9   89.9   Total Protein 6.0 - 8.5 g/dL 6.8  6.7  7.1   Total Bilirubin 0.0 - 1.2 mg/dL 0.5  0.5  0.5   Alkaline Phos 44 - 121 IU/L 142  147  161   AST 0 - 40 IU/L 34  37  48   ALT 0 - 32 IU/L 25  28  47     Lipid Panel     Component Value Date/Time   CHOL 203 (H) 08/28/2022 1114   TRIG 100 08/28/2022 1114   HDL 46 08/28/2022 1114   CHOLHDL 4.7 (H) 01/30/2022 1020   CHOLHDL 4.3 03/17/2012 1712   VLDL 13 03/17/2012 1712   LDLCALC 139 (H) 08/28/2022 1114    CBC    Component Value Date/Time   WBC 5.1 06/10/2018 1030   RBC 4.46 06/10/2018 1030   HGB 13.5 06/10/2018 1030   HCT 41.0 06/10/2018 1030   PLT 180 06/10/2018 1030   MCV 91.9 06/10/2018 1030   MCH 30.3 06/10/2018 1030   MCHC 32.9 06/10/2018 1030   RDW 12.8 06/10/2018 1030   LYMPHSABS 1.3 06/10/2018 1030   MONOABS 0.5 06/10/2018 1030   EOSABS 0.1 06/10/2018 1030   BASOSABS 0.0 06/10/2018 1030    Lab Results  Component Value Date   HGBA1C 5.7 05/13/2023    Assessment & Plan:      Hypertension Elevated blood pressure today, but normal at last visit. Patient is currently on Amlodipine . -Continue Amlodipine . -Repeat blood pressure in one month. If still elevated, consider regimen change. -Advise low sodium diet  and increased exercise.  Hyperlipidemia Patient is currently on Crestor  -Continue Crestor  -Refill prescription  -Repeat panel today -Low-cholesterol diet  Excessive Daytime Sleepiness Patient reports excessive sleepiness and sleeping around the clock. -Encourage increased physical activity and outdoor time when weather permits. -Consider enrollment in senior resources when available.  History of breast Cancer Patient is 5 years post-surgery and currently on Letrozole . -Continue Letrozole . -Plan for mammogram and bone density test in February. -Management as per oncology  Prediabetes -A1c of  5.7 -Continue to work on lifestyle modifications  General Health Maintenance -Order labs today including cholesterol and diabetes screening. -Results to be communicated via phone call as per patient's preference.          No orders of the defined types were placed in this encounter.   Follow-up: Return in about 1 month (around 06/13/2023) for Blood Pressure follow-up with PCP.       Corrina Sabin, MD, FAAFP. [facility] [place], [place] [phone]   05/13/2023, 5:45 PM

## 2023-05-13 NOTE — Patient Instructions (Signed)
 Managing Your Hypertension Hypertension, also called high blood pressure, is when the force of the blood pressing against the walls of the arteries is too strong. Arteries are blood vessels that carry blood from your heart throughout your body. Hypertension forces the heart to work harder to pump blood and may cause the arteries to become narrow or stiff. Understanding blood pressure readings A blood pressure reading includes a higher number over a lower number: The first, or top, number is called the systolic pressure. It is a measure of the pressure in your arteries as your heart beats. The second, or bottom number, is called the diastolic pressure. It is a measure of the pressure in your arteries as the heart relaxes. For most people, a normal blood pressure is below 120/80. Your personal target blood pressure may vary depending on your medical conditions, your age, and other factors. Blood pressure is classified into four stages. Based on your blood pressure reading, your health care provider may use the following stages to determine what type of treatment you need, if any. Systolic pressure and diastolic pressure are measured in a unit called millimeters of mercury (mmHg). Normal Systolic pressure: below 120. Diastolic pressure: below 80. Elevated Systolic pressure: 120-129. Diastolic pressure: below 80. Hypertension stage 1 Systolic pressure: 130-139. Diastolic pressure: 80-89. Hypertension stage 2 Systolic pressure: 140 or above. Diastolic pressure: 90 or above. How can this condition affect me? Managing your hypertension is very important. Over time, hypertension can damage the arteries and decrease blood flow to parts of the body, including the brain, heart, and kidneys. Having untreated or uncontrolled hypertension can lead to: A heart attack. A stroke. A weakened blood vessel (aneurysm). Heart failure. Kidney damage. Eye damage. Memory and concentration problems. Vascular  dementia. What actions can I take to manage this condition? Hypertension can be managed by making lifestyle changes and possibly by taking medicines. Your health care provider will help you make a plan to bring your blood pressure within a normal range. You may be referred for counseling on a healthy diet and physical activity. Nutrition  Eat a diet that is high in fiber and potassium, and low in salt (sodium), added sugar, and fat. An example eating plan is called the DASH diet. DASH stands for Dietary Approaches to Stop Hypertension. To eat this way: Eat plenty of fresh fruits and vegetables. Try to fill one-half of your plate at each meal with fruits and vegetables. Eat whole grains, such as whole-wheat pasta, brown rice, or whole-grain bread. Fill about one-fourth of your plate with whole grains. Eat low-fat dairy products. Avoid fatty cuts of meat, processed or cured meats, and poultry with skin. Fill about one-fourth of your plate with lean proteins such as fish, chicken without skin, beans, eggs, and tofu. Avoid pre-made and processed foods. These tend to be higher in sodium, added sugar, and fat. Reduce your daily sodium intake. Many people with hypertension should eat less than 1,500 mg of sodium a day. Lifestyle  Work with your health care provider to maintain a healthy body weight or to lose weight. Ask what an ideal weight is for you. Get at least 30 minutes of exercise that causes your heart to beat faster (aerobic exercise) most days of the week. Activities may include walking, swimming, or biking. Include exercise to strengthen your muscles (resistance exercise), such as weight lifting, as part of your weekly exercise routine. Try to do these types of exercises for 30 minutes at least 3 days a week. Do  not use any products that contain nicotine  or tobacco. These products include cigarettes, chewing tobacco, and vaping devices, such as e-cigarettes. If you need help quitting, ask your  health care provider. Control any long-term (chronic) conditions you have, such as high cholesterol or diabetes. Identify your sources of stress and find ways to manage stress. This may include meditation, deep breathing, or making time for fun activities. Alcohol use Do not drink alcohol if: Your health care provider tells you not to drink. You are pregnant, may be pregnant, or are planning to become pregnant. If you drink alcohol: Limit how much you have to: 0-1 drink a day for women. 0-2 drinks a day for men. Know how much alcohol is in your drink. In the [place] one drink equals one 12 oz bottle of beer (355 mL), one 5 oz glass of wine (148 mL), or one 1 oz glass of hard liquor (44 mL). Medicines Your health care provider may prescribe medicine if lifestyle changes are not enough to get your blood pressure under control and if: Your systolic blood pressure is 130 or higher. Your diastolic blood pressure is 80 or higher. Take medicines only as told by your health care provider. Follow the directions carefully. Blood pressure medicines must be taken as told by your health care provider. The medicine does not work as well when you skip doses. Skipping doses also puts you at risk for problems. Monitoring Before you monitor your blood pressure: Do not smoke, drink caffeinated beverages, or exercise within 30 minutes before taking a measurement. Use the bathroom and empty your bladder (urinate). Sit quietly for at least 5 minutes before taking measurements. Monitor your blood pressure at home as told by your health care provider. To do this: Sit with your back straight and supported. Place your feet flat on the floor. Do not cross your legs. Support your arm on a flat surface, such as a table. Make sure your upper arm is at heart level. Each time you measure, take two or three readings one minute apart and record the results. You may also need to have your blood pressure checked regularly by  your health care provider. General information Talk with your health care provider about your diet, exercise habits, and other lifestyle factors that may be contributing to hypertension. Review all the medicines you take with your health care provider because there may be side effects or interactions. Keep all follow-up visits. Your health care provider can help you create and adjust your plan for managing your high blood pressure. Where to find more information National Heart, Lung, and Blood Institute: [employer].is American Heart Association: www.heart.org Contact a health care provider if: You think you are having a reaction to medicines you have taken. You have repeated (recurrent) headaches. You feel dizzy. You have swelling in your ankles. You have trouble with your vision. Get help right away if: You develop a severe headache or confusion. You have unusual weakness or numbness, or you feel faint. You have severe pain in your chest or abdomen. You vomit repeatedly. You have trouble breathing. These symptoms may be an emergency. Get help right away. Call 911. Do not wait to see if the symptoms will go away. Do not drive yourself to the hospital. Summary Hypertension is when the force of blood pumping through your arteries is too strong. If this condition is not controlled, it may put you at risk for serious complications. Your personal target blood pressure may vary depending on your medical conditions,  your age, and other factors. For most people, a normal blood pressure is less than 120/80. Hypertension is managed by lifestyle changes, medicines, or both. Lifestyle changes to help manage hypertension include losing weight, eating a healthy, low-sodium diet, exercising more, stopping smoking, and limiting alcohol. This information is not intended to replace advice given to you by your health care provider. Make sure you discuss any questions you have with your health care  provider. Document Revised: 01/04/2021 Document Reviewed: 01/04/2021 Elsevier Patient Education  2024 [employer].

## 2023-05-14 LAB — CMP14+EGFR
ALT: 42 [IU]/L — ABNORMAL HIGH (ref 0–32)
AST: 42 [IU]/L — ABNORMAL HIGH (ref 0–40)
Albumin: 4 g/dL (ref 3.8–4.8)
Alkaline Phosphatase: 183 [IU]/L — ABNORMAL HIGH (ref 44–121)
BUN/Creatinine Ratio: 16 (ref 12–28)
BUN: 17 mg/dL (ref 8–27)
Bilirubin Total: 0.4 mg/dL (ref 0.0–1.2)
CO2: 23 mmol/L (ref 20–29)
Calcium: 10.1 mg/dL (ref 8.7–10.3)
Chloride: 112 mmol/L — ABNORMAL HIGH (ref 96–106)
Creatinine, Ser: 1.07 mg/dL — ABNORMAL HIGH (ref 0.57–1.00)
Globulin, Total: 2.9 g/dL (ref 1.5–4.5)
Glucose: 79 mg/dL (ref 70–99)
Potassium: 4.4 mmol/L (ref 3.5–5.2)
Sodium: 149 mmol/L — ABNORMAL HIGH (ref 134–144)
Total Protein: 6.9 g/dL (ref 6.0–8.5)
eGFR: 55 mL/min/{1.73_m2} — ABNORMAL LOW (ref >59)

## 2023-05-14 LAB — HEMOGLOBIN A1C
Est. average glucose Bld gHb Est-mCnc: 120 mg/dL
Hgb A1c MFr Bld: 5.8 % — ABNORMAL HIGH (ref 4.8–5.6)

## 2023-05-14 LAB — LP+NON-HDL CHOLESTEROL
Cholesterol, Total: 223 mg/dL — ABNORMAL HIGH (ref 100–199)
HDL: 51 mg/dL (ref >39)
LDL Chol Calc (NIH): 157 mg/dL — ABNORMAL HIGH (ref 0–99)
Total Non-HDL-Chol (LDL+VLDL): 172 mg/dL — ABNORMAL HIGH (ref 0–129)
Triglycerides: 85 mg/dL (ref 0–149)
VLDL Cholesterol Cal: 15 mg/dL (ref 5–40)

## 2023-05-15 ENCOUNTER — Other Ambulatory Visit: Payer: Self-pay | Admitting: Family Medicine

## 2023-05-15 MED ORDER — ROSUVASTATIN CALCIUM 40 MG PO TABS: 40.0000 mg | ORAL_TABLET | Freq: Every day | ORAL | 1 refills | Status: AC

## 2023-06-16 ENCOUNTER — Ambulatory Visit
Admission: RE | Admit: 2023-06-16 | Discharge: 2023-06-16 | Disposition: A | Discharge status: Home / Self Care | Payer: Medicare PPO | Source: Ambulatory Visit | Attending: Hematology and Oncology | Admitting: Hematology and Oncology

## 2023-06-16 ENCOUNTER — Ambulatory Visit
Admission: RE | Admit: 2023-06-16 | Discharge: 2023-06-16 | Disposition: A | Discharge status: Home / Self Care | Payer: Medicare PPO | Source: Ambulatory Visit | Attending: Obstetrics and Gynecology | Admitting: Obstetrics and Gynecology

## 2023-06-16 DIAGNOSIS — N958 Other specified menopausal and perimenopausal disorders: Secondary | ICD-10-CM | POA: Diagnosis not present

## 2023-06-16 DIAGNOSIS — Z13820 Encounter for screening for osteoporosis: Secondary | ICD-10-CM

## 2023-06-16 DIAGNOSIS — Z1231 Encounter for screening mammogram for malignant neoplasm of breast: Secondary | ICD-10-CM

## 2023-06-16 DIAGNOSIS — E2839 Other primary ovarian failure: Secondary | ICD-10-CM | POA: Diagnosis not present

## 2023-06-17 ENCOUNTER — Ambulatory Visit: Payer: Medicare PPO | Admitting: Family Medicine

## 2023-06-19 ENCOUNTER — Other Ambulatory Visit: Payer: Self-pay | Admitting: Hematology and Oncology

## 2023-06-19 DIAGNOSIS — R928 Other abnormal and inconclusive findings on diagnostic imaging of breast: Secondary | ICD-10-CM

## 2023-07-02 ENCOUNTER — Ambulatory Visit: Admission: RE | Admit: 2023-07-02 | Payer: Medicare PPO | Source: Ambulatory Visit

## 2023-07-02 ENCOUNTER — Ambulatory Visit
Admission: RE | Admit: 2023-07-02 | Discharge: 2023-07-02 | Disposition: A | Discharge status: Home / Self Care | Payer: Medicare PPO | Source: Ambulatory Visit | Attending: Hematology and Oncology | Admitting: Hematology and Oncology

## 2023-07-02 DIAGNOSIS — Z853 Personal history of malignant neoplasm of breast: Secondary | ICD-10-CM | POA: Diagnosis not present

## 2023-07-02 DIAGNOSIS — R921 Mammographic calcification found on diagnostic imaging of breast: Secondary | ICD-10-CM | POA: Diagnosis not present

## 2023-07-02 DIAGNOSIS — R928 Other abnormal and inconclusive findings on diagnostic imaging of breast: Secondary | ICD-10-CM

## 2023-07-03 ENCOUNTER — Other Ambulatory Visit: Payer: Self-pay | Admitting: Hematology and Oncology

## 2023-07-03 DIAGNOSIS — R921 Mammographic calcification found on diagnostic imaging of breast: Secondary | ICD-10-CM

## 2023-07-10 ENCOUNTER — Telehealth: Payer: Self-pay | Admitting: *Deleted

## 2023-07-10 NOTE — Telephone Encounter (Signed)
 Received VM from pt requesting to schedule yearly f/u with MD in April of 2025.  In-basket sent to scheduling team.

## 2023-07-14 ENCOUNTER — Telehealth: Payer: Self-pay | Admitting: Hematology and Oncology

## 2023-07-17 ENCOUNTER — Ambulatory Visit: Payer: Medicare PPO | Admitting: Family Medicine

## 2023-08-20 DIAGNOSIS — Z01419 Encounter for gynecological examination (general) (routine) without abnormal findings: Secondary | ICD-10-CM | POA: Diagnosis not present

## 2023-08-20 DIAGNOSIS — R32 Unspecified urinary incontinence: Secondary | ICD-10-CM | POA: Diagnosis not present

## 2023-08-25 ENCOUNTER — Telehealth: Payer: Self-pay

## 2023-08-25 NOTE — Telephone Encounter (Signed)
 Pt called to check for our address for transportation since she is coming in tomorrow.

## 2023-08-26 ENCOUNTER — Inpatient Hospital Stay: Attending: Hematology and Oncology | Admitting: Hematology and Oncology

## 2023-08-26 VITALS — BP 124/67 | HR 96 | Temp 98.3°F | Resp 18 | Ht 59.0 in | Wt 212.8 lb

## 2023-08-26 DIAGNOSIS — Z1721 Progesterone receptor positive status: Secondary | ICD-10-CM | POA: Insufficient documentation

## 2023-08-26 DIAGNOSIS — Z923 Personal history of irradiation: Secondary | ICD-10-CM | POA: Diagnosis not present

## 2023-08-26 DIAGNOSIS — Z170 Estrogen receptor positive status [ER+]: Secondary | ICD-10-CM | POA: Diagnosis not present

## 2023-08-26 DIAGNOSIS — Z1732 Human epidermal growth factor receptor 2 negative status: Secondary | ICD-10-CM | POA: Diagnosis not present

## 2023-08-26 DIAGNOSIS — Z79811 Long term (current) use of aromatase inhibitors: Secondary | ICD-10-CM | POA: Insufficient documentation

## 2023-08-26 DIAGNOSIS — Z79899 Other long term (current) drug therapy: Secondary | ICD-10-CM | POA: Diagnosis not present

## 2023-08-26 DIAGNOSIS — C50211 Malignant neoplasm of upper-inner quadrant of right female breast: Secondary | ICD-10-CM | POA: Insufficient documentation

## 2023-08-26 MED ORDER — LETROZOLE 2.5 MG PO TABS: 2.5000 mg | ORAL_TABLET | Freq: Every day | ORAL | 3 refills | Status: AC

## 2023-08-26 NOTE — Progress Notes (Signed)
 Patient Care Team: Marylu Soda, MD as PCP - General (Obstetrics and Gynecology) Cameron Cea, MD as Consulting Physician (Hematology and Oncology) Colie Dawes, MD as Attending Physician (Radiation Oncology) Boyce Byes, MD as Consulting Physician (General Surgery) Marylu Soda, MD as Consulting Physician (Obstetrics and Gynecology)  DIAGNOSIS:  Encounter Diagnosis  Name Primary?   Malignant neoplasm of upper-inner quadrant of right breast in female, estrogen receptor positive (HCC) Yes    SUMMARY OF ONCOLOGIC HISTORY: Oncology History  Malignant neoplasm of upper-inner quadrant of right breast in female, estrogen receptor positive (HCC)  04/27/2018 Initial Diagnosis   Screening mammogram detected right breast masses upper inner quadrant 7 mm and 4 mm, biopsy revealed grade 1 IDC ER 100%, PR 100%, HER-2 negative IHC 0, Ki-67 12%, T1BN0 stage Ia clinical stage   06/12/2018 Surgery   Right lumpectomy: 0.3 cm IDC with extracellular mucin, grade 1, with intermediate grade DCIS, margins negative, negative for lymphovascular or perineural invasion, 0/1 lymph node negative, ER 100%, PR 100%, HER-2 negative IHC 0, Ki-67 12%, T1 a N0 stage Ia   06/30/2018 Cancer Staging   Staging form: Breast, AJCC 8th Edition - Pathologic: Stage IA (pT1a, pN0, cM0, G1, ER+, PR+, HER2-) - Signed by Colie Dawes, MD on 06/30/2018   07/21/2018 - 08/10/2018 Radiation Therapy   Adjuvant radiation   08/2018 -  Anti-estrogen oral therapy   Anastrozole  daily     CHIEF COMPLIANT: Surveillance of breast cancer on letrozole  therapy  HISTORY OF PRESENT ILLNESS: History of Present Illness The patient, with a history of breast cancer, presents with concerns about two tiny calcium  deposits found in her breast during a recent check-up at the breast center. The patient is scheduled for a recheck in six months. The patient also reports occasional discomfort in her breast, described as a burning or strain type  sensation, which is not severe enough to warrant pain relief. The patient is currently on letrozole , which was changed from a previous prescription due to severe pain. The patient reports that the letrozole  is much better tolerated than the previous prescription. The patient also mentions occasional knee pain and strain in the calf after walking a certain distance, but this is not a constant occurrence. The patient is seeking prescriptions for a massage, a bath lifter, and a standing walker.     ALLERGIES:  is allergic to penicillins and sulfa antibiotics.  MEDICATIONS:  Current Outpatient Medications  Medication Sig Dispense Refill   letrozole  (FEMARA ) 2.5 MG tablet Take 1 tablet (2.5 mg total) by mouth daily. 90 tablet 3   rosuvastatin  (CRESTOR ) 40 MG tablet Take 1 tablet (40 mg total) by mouth daily. 90 tablet 1   triamcinolone  cream (KENALOG ) 0.1 % APPLY TO THE AFFECTED AREA(S) TWICE DAILY 45 g 3   amLODipine  (NORVASC ) 5 MG tablet Take 1 tablet (5 mg total) by mouth daily. 90 tablet 3   ergocalciferol  (DRISDOL ) 1.25 MG (50000 UT) capsule Take 1 capsule (50,000 Units total) by mouth once a week. (Patient not taking: Reported on 08/26/2023) 12 capsule 0   No current facility-administered medications for this visit.    PHYSICAL EXAMINATION: ECOG PERFORMANCE STATUS: 1 - Symptomatic but completely ambulatory  Vitals:   08/26/23 1000  BP: 124/67  Pulse: 96  Resp: 18  Temp: 98.3 F (36.8 C)  SpO2: 99%   Filed Weights   08/26/23 1000  Weight: 212 lb 12.8 oz (96.5 kg)    LABORATORY DATA:  I have reviewed the data as listed  Latest Ref Rng & Units 05/13/2023    5:15 PM 08/28/2022   11:14 AM 06/14/2022    8:33 AM  CMP  Glucose 70 - 99 mg/dL 79  91    BUN 8 - 27 mg/dL 17  10    Creatinine 6.21 - 1.00 mg/dL 3.08  6.57    Sodium 846 - 144 mmol/L 149  145    Potassium 3.5 - 5.2 mmol/L 4.4  3.6    Chloride 96 - 106 mmol/L 112  106    CO2 20 - 29 mmol/L 23  23    Calcium  8.7 - 10.3  mg/dL 96.2  9.9    Total Protein 6.0 - 8.5 g/dL 6.9  6.8  6.7   Total Bilirubin 0.0 - 1.2 mg/dL 0.4  0.5  0.5   Alkaline Phos 44 - 121 IU/L 183  142  147   AST 0 - 40 IU/L 42  34  37   ALT 0 - 32 IU/L 42  25  28     Lab Results  Component Value Date   WBC 5.1 06/10/2018   HGB 13.5 06/10/2018   HCT 41.0 06/10/2018   MCV 91.9 06/10/2018   PLT 180 06/10/2018   NEUTROABS 3.2 06/10/2018    ASSESSMENT & PLAN:  Malignant neoplasm of upper-inner quadrant of right breast in female, estrogen receptor positive (HCC) 06/12/2018:Right lumpectomy: 0.3 cm IDC with extracellular mucin, grade 1, with intermediate grade DCIS, margins negative, negative for lymphovascular or perineural invasion, 0/1 lymph node negative, ER 100%, PR 100%, HER-2 negative IHC 0, Ki-67 12%, T1 a N0 stage Ia   Treatment plan: 1.  Adjuvant radiation therapy 07/21/2018-08/10/2018 2.  Follow-up adjuvant antiestrogen therapy with anastrozole  started 08/19/2018 stopped April 2021, switched to letrozole  09/12/20   Letrozole  toxicities: Intermittent muscle aches and pains but otherwise tolerating it extremely well   Breast cancer surveillance 1.  Breast exam 08/26/2023: Benign 2.  Mammogram 06/18/2023 possible asymmetry right breast 3.  Mammogram right breast diagnostic 07/02/2023: 0.3 cm dystrophic calcifications UIQ next to right lumpectomy site.  Plan is for 5-month follow-up which will be on 12/30/2023    Return to clinic in 1 year for follow-up     No orders of the defined types were placed in this encounter.  The patient has a good understanding of the overall plan. she agrees with it. she will call with any problems that may develop before the next visit here. Total time spent: 30 mins including face to face time and time spent for planning, charting and co-ordination of care   Margert Sheerer, MD 08/26/23

## 2023-08-26 NOTE — Assessment & Plan Note (Signed)
 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  Adjuvant radiation therapy 07/21/2018-08/10/2018 2.  Follow-up adjuvant antiestrogen therapy with anastrozole  started 08/19/2018 stopped April 2021, switched to letrozole  09/12/20   Letrozole  toxicities:   Breast cancer surveillance 1.  Breast exam 08/26/2023: Benign 2.  Mammogram 06/18/2023 possible asymmetry right breast 3.  Mammogram right breast diagnostic 07/02/2023: 0.3 cm dystrophic calcifications UIQ next to right lumpectomy site.  Plan is for 70-month follow-up which will be on 12/30/2023    Return to clinic in 1 year for follow-up

## 2023-08-27 ENCOUNTER — Telehealth: Payer: Self-pay | Admitting: Hematology and Oncology

## 2023-08-27 NOTE — Telephone Encounter (Signed)
 Confirmed with pt scheduled appt date and time

## 2023-09-03 ENCOUNTER — Ambulatory Visit: Admitting: Family Medicine

## 2023-09-03 ENCOUNTER — Telehealth: Payer: Self-pay

## 2023-09-03 NOTE — Telephone Encounter (Signed)
 Called pt and LVM stating she would need to contact her PCP regarding desire for electric rollator. Advised pt to call with any concerns.

## 2023-09-08 ENCOUNTER — Telehealth: Payer: Self-pay

## 2023-09-08 ENCOUNTER — Encounter: Payer: Self-pay | Admitting: Family Medicine

## 2023-09-08 ENCOUNTER — Ambulatory Visit: Attending: Family Medicine | Admitting: Family Medicine

## 2023-09-08 VITALS — BP 161/89 | HR 98 | Ht 59.0 in | Wt 205.2 lb

## 2023-09-08 DIAGNOSIS — R29898 Other symptoms and signs involving the musculoskeletal system: Secondary | ICD-10-CM | POA: Diagnosis not present

## 2023-09-08 DIAGNOSIS — E785 Hyperlipidemia, unspecified: Secondary | ICD-10-CM | POA: Diagnosis not present

## 2023-09-08 DIAGNOSIS — Z170 Estrogen receptor positive status [ER+]: Secondary | ICD-10-CM | POA: Diagnosis not present

## 2023-09-08 DIAGNOSIS — R7303 Prediabetes: Secondary | ICD-10-CM | POA: Diagnosis not present

## 2023-09-08 DIAGNOSIS — C50211 Malignant neoplasm of upper-inner quadrant of right female breast: Secondary | ICD-10-CM | POA: Diagnosis not present

## 2023-09-08 MED ORDER — MISC. DEVICES MISC: 0 refills | Status: AC

## 2023-09-08 NOTE — Telephone Encounter (Signed)
 Call placed to [facility]: [phone] to obtain more information about the electric rollator.  I had to leave a message requesting a call back.

## 2023-09-08 NOTE — Patient Instructions (Signed)
 VISIT SUMMARY:  Today, we discussed your blood pressure management, muscle pain related to your breast cancer treatment, and mobility assistance. We also reviewed your cholesterol levels and planned for a follow-up on your urinary leakage with your gynecologist.  YOUR PLAN:  -BREAST CANCER, CURRENT TREATMENT: You are currently being treated with letrozole  for estrogen receptor-positive breast cancer. Letrozole  is preferred because it causes less severe muscle pain compared to anastrozole . You are scheduled for a breast ultrasound in August and will continue to follow up with your oncologist annually.  -MUSCLE PAIN DUE TO LETROZOLE : You have been experiencing muscle aches, especially in your legs, since starting letrozole . These symptoms were worse with anastrozole . To help with your mobility and reduce muscle fatigue, we will provide a prescription for an electric rollator and coordinate with a medical equipment store to obtain it.  -PRIMARY HYPERTENSION: Your blood pressure readings have been high during clinic visits but normal at home and with another provider, which may be due to white coat syndrome. You missed your amlodipine  dose today, which could have contributed to the high reading. To get accurate readings, please take your amlodipine  on the day of your visit, even if you are fasting for blood work. We will recheck your blood pressure in one month and avoid increasing your amlodipine  dose until we have accurate readings.  -HYPERLIPIDEMIA, UNSPECIFIED: Your cholesterol levels were elevated in January, and we need to assess your current levels. We will order a fasting lipid panel to determine if any treatment adjustments are needed.  INSTRUCTIONS:  Please take your amlodipine  on the day of your visit, even if you are fasting for blood work. We will recheck your blood pressure in one month. Proceed with your scheduled breast ultrasound in August and continue to follow up with your oncologist  annually. We will also order a fasting lipid panel to assess your current cholesterol levels.

## 2023-09-08 NOTE — Progress Notes (Signed)
 Subjective:  Patient ID: Caitlyn Mccoy, female    DOB: May 19, 1950  Age: 73 y.o. MRN: [id number]  CC: Hypertension (Discuss electric rollator)     Discussed the use of AI scribe software for clinical note transcription with the patient, who gave verbal consent to proceed.  History of Present Illness Caitlyn Mccoy is a 73 year old female with a history of previous CVA, hypertension, hyperlipidemia, Right breast ca ( diagnosed 2020 s/p lumpectomy, adjuvant radiation, adjuvant antiestrogen therapy) on Femara  , left eye visual loss, thyroid  nodule (radioactive uptake scan in 2005) who presents for blood pressure management and mobility assistance.  Her blood pressure is consistently high during clinic visits but normal during a visit with her oncologist in April. She did not take amlodipine  this morning due to anticipated blood work. Her blood pressure was 165/91 in January and 124/67 during her visit with the oncologist. She is concerned about the variability in her readings.  She is on letrozole  for breast cancer and experiences muscle aches and weakness, particularly in her right leg, since starting the medication. These symptoms were more severe with anastrozole . Muscle weakness and pain worsen with fatigue, impacting her mobility and daily activities. She is interested in obtaining an electric rollator to aid her mobility, as she lives alone and experiences significant fatigue and weakness in her legs, especially when walking and a cane will not suffice.  She experiences urinary leakage, which she attributes to a possible prolapsed bladder, and plans to discuss it further with her gynecologist. She is taking rosuvastatin  for elevated cholesterol, noted in January, and has not had breakfast today in anticipation of blood work to recheck her cholesterol levels.    Past Medical History:  Diagnosis Date   Blindness    left eye since birth   Breast cancer (HCC) 06/2018   right breast ca    Breast cancer of upper-inner quadrant of right female breast (HCC) 06/12/2018   GERD (gastroesophageal reflux disease)    Hiatal hernia    History of cervical dysplasia    in her 33s, underwent partial cervix removal and cryosurgery   Hypertension    Stroke [facility]) 2000   denies deficits   Yeast infection     Past Surgical History:  Procedure Laterality Date   BREAST LUMPECTOMY Right 06/2018   BREAST LUMPECTOMY WITH RADIOACTIVE SEED AND SENTINEL LYMPH NODE BIOPSY Right 06/12/2018   Procedure: RIGHT BREAST LUMPECTOMY WITH RADIOACTIVE SEED AND  RIGHT AXILLARY  DEEP SENTINEL LYMPH NODE BIOPSY WITH BLUE DYE INJECTION ERAS PATHWAY;  Surgeon: Boyce Byes, MD;  Location: [facility];  Service: General;  Laterality: Right;  PECTORAL BLOCK   CAROTID ENDARTERECTOMY  2001   TONSILLECTOMY     WISDOM TOOTH EXTRACTION      Family History  Problem Relation Age of Onset   Diabetes Mother    Heart attack Father    Breast cancer Neg Hx     Social History   Socioeconomic History   Marital status: Single    Spouse name: Not on file   Number of children: Not on file   Years of education: Not on file   Highest education level: Not on file  Occupational History   Not on file  Tobacco Use   Smoking status: Former    Current packs/day: 0.00    Average packs/day: 0.5 packs/day for 20.0 years (10.0 ttl pk-yrs)    Types: Cigarettes    Start date: 06/30/1969    Quit date:  06/30/1989    Years since quitting: 34.2   Smokeless tobacco: Never  Vaping Use   Vaping status: Never Used  Substance and Sexual Activity   Alcohol use: No   Drug use: No   Sexual activity: Not Currently  Other Topics Concern   Not on file  Social History Narrative   Lives alone in [place], daughter also lives in [place]. Walks with a cane. Retired was a Curator the [employer]   Social Drivers of Corporate investment banker Strain: Low Risk  (05/13/2023)   Overall Financial Resource  Strain (CARDIA)    Difficulty of Paying Living Expenses: Not very hard  Food Insecurity: Food Insecurity Present (05/13/2023)   Hunger Vital Sign    Worried About Running Out of Food in the Last Year: Sometimes true    Ran Out of Food in the Last Year: Never true  Transportation Needs: No Transportation Needs (05/13/2023)   PRAPARE - Administrator, Civil Service (Medical): No    Lack of Transportation (Non-Medical): No  Physical Activity: Insufficiently Active (05/13/2023)   Exercise Vital Sign    Days of Exercise per Week: 7 days    Minutes of Exercise per Session: 10 min  Stress: No Stress Concern Present (05/13/2023)   [employer] of Occupational Health - Occupational Stress Questionnaire    Feeling of Stress : Not at all  Social Connections: Unknown (05/13/2023)   Social Connection and Isolation Panel [NHANES]    Frequency of Communication with Friends and Family: Once a week    Frequency of Social Gatherings with Friends and Family: Not on file    Attends Religious Services: 1 to 4 times per year    Active Member of [employer] or Organizations: Yes    Attends Banker Meetings: 1 to 4 times per year    Marital Status: Widowed    Allergies  Allergen Reactions   Penicillins    Sulfa Antibiotics     Outpatient Medications Prior to Visit  Medication Sig Dispense Refill   ergocalciferol  (DRISDOL ) 1.25 MG (50000 UT) capsule Take 1 capsule (50,000 Units total) by mouth once a week. 12 capsule 0   letrozole  (FEMARA ) 2.5 MG tablet Take 1 tablet (2.5 mg total) by mouth daily. 90 tablet 3   rosuvastatin  (CRESTOR ) 40 MG tablet Take 1 tablet (40 mg total) by mouth daily. 90 tablet 1   triamcinolone  cream (KENALOG ) 0.1 % APPLY TO THE AFFECTED AREA(S) TWICE DAILY 45 g 3   amLODipine  (NORVASC ) 5 MG tablet Take 1 tablet (5 mg total) by mouth daily. 90 tablet 3   No facility-administered medications prior to visit.     ROS Review of Systems  Constitutional:  Negative  for activity change and appetite change.  HENT:  Negative for sinus pressure and sore throat.   Eyes:  Positive for visual disturbance.  Respiratory:  Negative for chest tightness, shortness of breath and wheezing.   Cardiovascular:  Negative for chest pain and palpitations.  Gastrointestinal:  Negative for abdominal distention, abdominal pain and constipation.  Genitourinary: Negative.   Musculoskeletal:        See HPI  Psychiatric/Behavioral:  Negative for behavioral problems and dysphoric mood.     Objective:  BP (!) 167/100   Pulse 98   Ht 4\' 11"  (1.499 m)   Wt 205 lb 3.2 oz (93.1 kg)   SpO2 97%   BMI 41.45 kg/m      09/08/2023    9:38 AM  08/26/2023   10:00 AM 05/13/2023    4:18 PM  BP/Weight  Systolic BP 167 124 165  Diastolic BP 100 67 91  Wt. (Lbs) 205.2 212.8 205.8  BMI 41.45 kg/m2 42.98 kg/m2 41.57 kg/m2      Physical Exam Constitutional:      Appearance: She is well-developed.  Cardiovascular:     Rate and Rhythm: Normal rate.     Heart sounds: Normal heart sounds. No murmur heard. Pulmonary:     Effort: Pulmonary effort is normal.     Breath sounds: Normal breath sounds. No wheezing or rales.  Chest:     Chest wall: No tenderness.  Abdominal:     General: Bowel sounds are normal. There is no distension.     Palpations: Abdomen is soft. There is no mass.     Tenderness: There is no abdominal tenderness.  Musculoskeletal:        General: Normal range of motion.     Right lower leg: No edema.     Left lower leg: No edema.  Neurological:     Mental Status: She is alert and oriented to person, place, and time.  Psychiatric:        Mood and Affect: Mood normal.        Latest Ref Rng & Units 05/13/2023    5:15 PM 08/28/2022   11:14 AM 06/14/2022    8:33 AM  CMP  Glucose 70 - 99 mg/dL 79  91    BUN 8 - 27 mg/dL 17  10    Creatinine 7.84 - 1.00 mg/dL 6.96  2.95    Sodium 284 - 144 mmol/L 149  145    Potassium 3.5 - 5.2 mmol/L 4.4  3.6    Chloride 96 -  106 mmol/L 112  106    CO2 20 - 29 mmol/L 23  23    Calcium  8.7 - 10.3 mg/dL 13.2  9.9    Total Protein 6.0 - 8.5 g/dL 6.9  6.8  6.7   Total Bilirubin 0.0 - 1.2 mg/dL 0.4  0.5  0.5   Alkaline Phos 44 - 121 IU/L 183  142  147   AST 0 - 40 IU/L 42  34  37   ALT 0 - 32 IU/L 42  25  28     Lipid Panel     Component Value Date/Time   CHOL 223 (H) 05/13/2023 1715   TRIG 85 05/13/2023 1715   HDL 51 05/13/2023 1715   CHOLHDL 4.7 (H) 01/30/2022 1020   CHOLHDL 4.3 03/17/2012 1712   VLDL 13 03/17/2012 1712   LDLCALC 157 (H) 05/13/2023 1715    CBC    Component Value Date/Time   WBC 5.1 06/10/2018 1030   RBC 4.46 06/10/2018 1030   HGB 13.5 06/10/2018 1030   HCT 41.0 06/10/2018 1030   PLT 180 06/10/2018 1030   MCV 91.9 06/10/2018 1030   MCH 30.3 06/10/2018 1030   MCHC 32.9 06/10/2018 1030   RDW 12.8 06/10/2018 1030   LYMPHSABS 1.3 06/10/2018 1030   MONOABS 0.5 06/10/2018 1030   EOSABS 0.1 06/10/2018 1030   BASOSABS 0.0 06/10/2018 1030    Lab Results  Component Value Date   HGBA1C 5.8 (H) 05/13/2023       Assessment & Plan Breast cancer, current treatment Diagnosed 2020 s/p lumpectomy, adjuvant radiation, adjuvant antiestrogen therapy on Femara  On letrozole  for estrogen receptor-positive breast cancer. Letrozole  preferred due to less severe muscle pain. Scheduled for breast ultrasound in  August. Annual oncologist follow-ups planned. - Continue letrozole  as prescribed. - Proceed with scheduled breast ultrasound in August. - Follow up with oncologist annually.  Muscle pain due to letrozole / leg weakness Intermittent muscle aches, especially in legs, since starting letrozole . Symptoms were worse with anastrozole . Electric rollator recommended for mobility and muscle fatigue reduction. - Write prescription for an electric rollator to assist with mobility and reduce muscle fatigue. - Coordinate with medical equipment store to obtain the electric rollator.  Primary  Hypertension Elevated blood pressure readings in clinic, possibly due to white coat syndrome. Normal readings with another provider. Missed amlodipine  dose may have contributed to elevated readings. Accurate readings needed to avoid unnecessary medication adjustments. - Instruct her to take amlodipine  on the day of the visit, even if fasting for blood work. - Recheck blood pressure in one month. - Avoid increasing amlodipine  dose until accurate blood pressure readings are obtained.  Hyperlipidemia, unspecified Cholesterol levels elevated in January. Current lipid status unknown. Fasting lipid panel needed to assess cholesterol levels and determine treatment adjustments. - Order fasting lipid panel to assess current cholesterol levels. - Continue statin - Low-cholesterol diet      No orders of the defined types were placed in this encounter.   Follow-up: No follow-ups on file.       Joaquin Mulberry, MD, FAAFP. [facility] [place], [place] [phone]   09/08/2023, 9:41 AM

## 2023-09-09 ENCOUNTER — Other Ambulatory Visit: Payer: Self-pay | Admitting: Family Medicine

## 2023-09-09 LAB — CMP14+EGFR
ALT: 35 IU/L — ABNORMAL HIGH (ref 0–32)
AST: 30 IU/L (ref 0–40)
Albumin: 4.2 g/dL (ref 3.8–4.8)
Alkaline Phosphatase: 178 IU/L — ABNORMAL HIGH (ref 44–121)
BUN/Creatinine Ratio: 15 (ref 12–28)
BUN: 17 mg/dL (ref 8–27)
Bilirubin Total: 0.5 mg/dL (ref 0.0–1.2)
CO2: 20 mmol/L (ref 20–29)
Calcium: 10.1 mg/dL (ref 8.7–10.3)
Chloride: 110 mmol/L — ABNORMAL HIGH (ref 96–106)
Creatinine, Ser: 1.14 mg/dL — ABNORMAL HIGH (ref 0.57–1.00)
Globulin, Total: 2.8 g/dL (ref 1.5–4.5)
Glucose: 96 mg/dL (ref 70–99)
Potassium: 4.4 mmol/L (ref 3.5–5.2)
Sodium: 145 mmol/L — ABNORMAL HIGH (ref 134–144)
Total Protein: 7 g/dL (ref 6.0–8.5)
eGFR: 51 mL/min/1.73 — ABNORMAL LOW

## 2023-09-09 LAB — LP+NON-HDL CHOLESTEROL
Cholesterol, Total: 256 mg/dL — ABNORMAL HIGH (ref 100–199)
HDL: 47 mg/dL (ref >39)
LDL Chol Calc (NIH): 192 mg/dL — ABNORMAL HIGH (ref 0–99)
Total Non-HDL-Chol (LDL+VLDL): 209 mg/dL — ABNORMAL HIGH (ref 0–129)
Triglycerides: 99 mg/dL (ref 0–149)
VLDL Cholesterol Cal: 17 mg/dL (ref 5–40)

## 2023-09-09 MED ORDER — EZETIMIBE 10 MG PO TABS: 10.0000 mg | ORAL_TABLET | Freq: Every day | ORAL | 3 refills | Status: DC

## 2023-09-10 NOTE — Telephone Encounter (Signed)
 I called [facility] [place]/ [facility] and had to leave a message requesting a call back. I also called the patient and had to leave a message for her. I want her to know that I tried to reach [employer] again and had to leave a message.  We need more information about ordering an electric rollator- where to send the order, what specific information is needed for the order for insurance coverage.

## 2023-09-10 NOTE — Telephone Encounter (Signed)
 Copied from CRM [id number]. Topic: Clinical - Medication Question >> Sep 10, 2023  3:20 PM Emylou G wrote:  Reason for CRM: Patient called checking status of:  [facility]: [phone] [phone] electric rollator) .Aaron Aas Patient adv she is okay with you releasing info to them too - she only wants Wellsite geologist .Aaron Aas She doesn't want nonelectric - she lives alone.  >> Sep 10, 2023  3:43 PM Opal Bill wrote: Pt called back and provided the fax # of [phone] for Nurse Jerryl Morin. She advised that when contacting the [facility], try telling them what is needed so that the office is not forwarded to a voicemail.

## 2023-09-16 ENCOUNTER — Ambulatory Visit: Payer: Self-pay

## 2023-09-17 NOTE — Telephone Encounter (Signed)
 Call received from [place], [facility]: [phone]  ( Mark's cell ) stating that he has checked with local DME providers as well as some in [place] and [place] and he has not been able to find an Manufacturing engineer with the specifications that the patient has requested. He said she will need to provide him with more specific information about the model that she wants and he will call her and share this information with her.   I explained that our clinic has not ordered an electric rollator before and I am not sure if her insurance will cover the cost.  He said she is willing to pay for a portion of the cost, possibly with a payment plan. I told him that she will need to arrange any specific payment requirements with the DME company.  He said he understood and will call her as soon as he gets off the phone with me.

## 2023-09-18 DIAGNOSIS — C50211 Malignant neoplasm of upper-inner quadrant of right female breast: Secondary | ICD-10-CM | POA: Diagnosis not present

## 2023-09-22 DIAGNOSIS — N8110 Cystocele, unspecified: Secondary | ICD-10-CM | POA: Diagnosis not present

## 2023-09-22 DIAGNOSIS — C50211 Malignant neoplasm of upper-inner quadrant of right female breast: Secondary | ICD-10-CM | POA: Diagnosis not present

## 2023-09-23 ENCOUNTER — Telehealth: Payer: Self-pay | Admitting: Family Medicine

## 2023-09-23 NOTE — Telephone Encounter (Signed)
 I received a call from [facility], [facility], today and the patient was with him.  He explained that she has the specific information about the device she would like: [employer] 3:1 Art gallery manager.  I asked him to please email me the information about the mobility device and we can send a prescription to that company.  The company would then have to get the approval from [facility] for her insurance to cover it.   He also said that she has a prescription for a bath lifter from her oncologist but he has not been able to find a DME company locally to offers that equipment. I instructed her to please check with the oncologist as they may be more familiar with that piece of DME.

## 2023-09-23 NOTE — Telephone Encounter (Signed)
 FYI

## 2023-09-23 NOTE — Telephone Encounter (Signed)
 Copied from CRM [id number]. Topic: General - Other >> Sep 23, 2023 11:57 AM Rosamond Comes wrote:  Reason for CRM: [facility] [phone] with an urgent matter prescription issue

## 2023-09-24 NOTE — Telephone Encounter (Signed)
 I spoke to [facility] after that message was received.  It is documented in another telephone encounter

## 2023-09-25 NOTE — Telephone Encounter (Signed)
 I spoke to [employer] again today and the call is documented in another telephone encounter.  He said he did not have any additional information to share with me about the product the patient is requesting.

## 2023-09-25 NOTE — Telephone Encounter (Signed)
 I spoke to [facility], [facility] and informed him that I have not been able to find a distributor for the product the patient is requesting.  The only place I find it is [place], not a company that will bill her insurance. I checked [employer] and those sites show car vinyl wraps for cars.  He said he will continue to search for more information about ordering this product

## 2023-10-09 NOTE — Telephone Encounter (Signed)
 I spoke to the patient and informed her that I have been in touch with [facility] and he has not been able to find a company other than [employer] that provides the product the patient is requesting. I also explained to her that I checked the [facility] store online and it sells vinyl wraps for cars, not the product she is looking for.  She thought that sending the prescription to [facility] to see if they would cover it might help and I told her that I can check on that for her.  She was very appreciative of the update on the status of her request.

## 2023-10-17 ENCOUNTER — Telehealth: Payer: Self-pay

## 2023-10-17 NOTE — Telephone Encounter (Signed)
 S/w Caitlyn Mccoy by phone today regarding questions about medical supply stores. She is asking for the name of recommended stores. Advised pt we refer pts to [facility] in [place] as well as [facility] in [facility]. She verbalized thanks and understanding.

## 2023-10-21 ENCOUNTER — Ambulatory Visit: Attending: Family Medicine | Admitting: Family Medicine

## 2023-10-21 ENCOUNTER — Encounter: Payer: Self-pay | Admitting: Family Medicine

## 2023-10-21 ENCOUNTER — Other Ambulatory Visit: Payer: Self-pay | Admitting: Family Medicine

## 2023-10-21 VITALS — BP 122/84 | HR 100 | Temp 98.8°F | Ht 59.0 in | Wt 210.0 lb

## 2023-10-21 DIAGNOSIS — R600 Localized edema: Secondary | ICD-10-CM

## 2023-10-21 DIAGNOSIS — E785 Hyperlipidemia, unspecified: Secondary | ICD-10-CM

## 2023-10-21 DIAGNOSIS — R7303 Prediabetes: Secondary | ICD-10-CM

## 2023-10-21 DIAGNOSIS — I10 Essential (primary) hypertension: Secondary | ICD-10-CM

## 2023-10-21 LAB — POCT GLYCOSYLATED HEMOGLOBIN (HGB A1C): Hemoglobin A1C: 5.7 % — AB (ref 4.0–5.6)

## 2023-10-21 MED ORDER — MISC. DEVICES MISC: 0 refills | Status: AC

## 2023-10-21 MED ORDER — MISC. DEVICES MISC
0 refills | Status: DC
Start: 2023-10-21 — End: 2023-10-21

## 2023-10-21 MED ORDER — AMLODIPINE BESYLATE 5 MG PO TABS: 5.0000 mg | ORAL_TABLET | Freq: Every day | ORAL | 3 refills | Status: DC

## 2023-10-21 NOTE — Telephone Encounter (Signed)
 I met with the patient when she was in the clinic this morning and she explained that she found the mobility device that she wants- The Wheelator-part rollator, part power wheelchair.  She stated that she wants it in yellow and would like all of the accessories for it.  It can be ordered through [employer]: [phone].  She went on to explain that she wants to purchase this and needs assistance but she had to leave the clinic because her ride was waiting for her to take her home.  I told her that I would contact her later in the afternoon and we can call [employer] together to obtain more information and she was in agreement with that plan.  After reviewing the product on the company website, it is noted that Medicare does not pay for this product.  I shared this with the patient and she said she called [employer] and was told that the provider can submit an order for a prior authorization.  She stated she explained exactly what she wanted to the [facility] rep.  She then said that the rep told her that even if it is not approved, she just needs to submit the receipt for what she paid for the device to [facility] Claims and [employer] will reimburse her at least 80% of the cost.  She said she wanted to contact [employer] and place an order over the phone, speaking to someone . She did not feel that submitting an order on-line as she does not think that would be secure with her personal information. I asked if she would like to check the status of a prior auth before paying for the device and she said no, she was confident that [facility] will reimburse her even if the authorization is denied and wanted to proceed with ordering the device.   We conference called [employer] and spoke to [place] who agreed to take the order information over the phone.  She explained to the patient that the do not [employer] and they do not accept payment plans,  the patient would need to pay upfront and she said that  other clients have submitted their receipt of payment to their insurance companies for reimbursement.   The patient then provided [facility] with the necessary information to place an order. Kathaleen Pale agreed to text the patient the order information and receipt as well as the delivery tracking information. The patient explained that she does not have email and was appreciative that the information could be screen shot and then text to her.  Christy instructed the patient to call her with any questions and said she could be reached at her direct # [phone].  She explained to the patient that [employer] would be delivering the device in about 4-5 days and it would be assembled except for the accessories that are free with this order. The patient was very pleased that this order was placed.  I told her that I will request an order from Dr Adan Holms specifying this wheelator and I would contact [employer] regarding where to send the order/prior Auth.

## 2023-10-21 NOTE — Patient Instructions (Signed)
 VISIT SUMMARY:  Today, you came in for a routine follow-up appointment. Your blood pressure is well-controlled, and we discussed your ongoing management for high cholesterol, breast cancer follow-up, and leg swelling. We also reviewed your upcoming appointments with your oncologist and urogynecologist.  YOUR PLAN:  -BREAST CANCER FOLLOW-UP: You had a mammogram in February 2025 that showed benign calcifications at the site of your lumpectomy. This means there are no signs of cancer, but we need to keep monitoring it. Please attend your follow-up appointment at the breast center in August for further evaluation.  -PRIMARY HYPERTENSION: Your blood pressure is well-controlled with your current medication, amlodipine . High blood pressure can lead to serious health issues if not managed. Continue taking amlodipine  as prescribed, and we have sent a refill to your pharmacy.  -HYPERLIPIDEMIA: Your cholesterol levels were high in May, but you are on the maximum dose of your medications, rosuvastatin  and ezetimibe . High cholesterol can increase the risk of heart disease. We will recheck your cholesterol levels in six months. Continue taking your medications and try to follow a heart-healthy diet.  -PELVIC ORGAN PROLAPSE: You have been referred to a urogynecologist for your prolapse, which means that some of your pelvic organs have shifted out of place. Please attend your appointment in September to discuss a management plan.  -LEG SWELLING: You have mild leg swelling, likely from sitting for long periods. This can be uncomfortable but is not usually serious. Try to elevate your legs while sitting to reduce the swelling.  INSTRUCTIONS:  Please schedule a follow-up appointment in six months to reassess your blood pressure and cholesterol levels. Attend your breast center follow-up in August and your urogynecology appointment in September.

## 2023-10-21 NOTE — Progress Notes (Signed)
 Subjective:  Patient ID: Caitlyn Mccoy, female    DOB: Jun 10, 1950  Age: 73 y.o. MRN: [id number]  CC: No chief complaint on file.     Discussed the use of AI scribe software for clinical note transcription with the patient, who gave verbal consent to proceed.  History of Present Illness Caitlyn Mccoy is a 73 year old female with a history of previous CVA, hypertension, hyperlipidemia, Right breast ca ( diagnosed 2020 s/p lumpectomy, adjuvant radiation, adjuvant antiestrogen therapy) on Femara  , left eye visual loss, thyroid  nodule (radioactive uptake scan in 2005) who presents for blood pressure management   Her hypertension is well-controlled with amlodipine , with a current blood pressure of 122/84 mmHg. P was elevated at her last visit but she did not take her amlodipine  at that visit.  She experiences no lightheadedness or dizziness. Hyperlipidemia is managed with Crestor  40 mg and Zetia , though her last cholesterol check showed elevated levels.  She notes leg swelling, which she attributes to prolonged sitting, but denies abdominal pain. She is following up with her oncologist and hds a scheduled appointment in April. A mammogram in February 2025 showed calcifications at the lumpectomy site, with a follow-up scheduled for August. She is awaiting a urogynecology appointment for prolapse in September.    Past Medical History:  Diagnosis Date   Blindness    left eye since birth   Breast cancer (HCC) 06/2018   right breast ca   Breast cancer of upper-inner quadrant of right female breast (HCC) 06/12/2018   GERD (gastroesophageal reflux disease)    Hiatal hernia    History of cervical dysplasia    in her 64s, underwent partial cervix removal and cryosurgery   Hypertension    Stroke [facility]) 2000   denies deficits   Yeast infection     Past Surgical History:  Procedure Laterality Date   BREAST LUMPECTOMY Right 06/2018   BREAST LUMPECTOMY WITH RADIOACTIVE SEED AND SENTINEL LYMPH  NODE BIOPSY Right 06/12/2018   Procedure: RIGHT BREAST LUMPECTOMY WITH RADIOACTIVE SEED AND  RIGHT AXILLARY  DEEP SENTINEL LYMPH NODE BIOPSY WITH BLUE DYE INJECTION ERAS PATHWAY;  Surgeon: Boyce Byes, MD;  Location: [facility];  Service: General;  Laterality: Right;  PECTORAL BLOCK   CAROTID ENDARTERECTOMY  2001   TONSILLECTOMY     WISDOM TOOTH EXTRACTION      Family History  Problem Relation Age of Onset   Diabetes Mother    Heart attack Father    Breast cancer Neg Hx     Social History   Socioeconomic History   Marital status: Single    Spouse name: Not on file   Number of children: Not on file   Years of education: Not on file   Highest education level: Not on file  Occupational History   Not on file  Tobacco Use   Smoking status: Former    Current packs/day: 0.00    Average packs/day: 0.5 packs/day for 20.0 years (10.0 ttl pk-yrs)    Types: Cigarettes    Start date: 06/30/1969    Quit date: 06/30/1989    Years since quitting: 34.3   Smokeless tobacco: Never  Vaping Use   Vaping status: Never Used  Substance and Sexual Activity   Alcohol use: No   Drug use: No   Sexual activity: Not Currently  Other Topics Concern   Not on file  Social History Narrative   Lives alone in [place], daughter also lives in [place]. Walks with a  cane. Retired was a Curator the [employer]   Social Drivers of Corporate investment banker Strain: Low Risk  (05/13/2023)   Overall Financial Resource Strain (CARDIA)    Difficulty of Paying Living Expenses: Not very hard  Food Insecurity: Food Insecurity Present (05/13/2023)   Hunger Vital Sign    Worried About Running Out of Food in the Last Year: Sometimes true    Ran Out of Food in the Last Year: Never true  Transportation Needs: No Transportation Needs (05/13/2023)   PRAPARE - Administrator, Civil Service (Medical): No    Lack of Transportation (Non-Medical): No  Physical Activity:  Insufficiently Active (05/13/2023)   Exercise Vital Sign    Days of Exercise per Week: 7 days    Minutes of Exercise per Session: 10 min  Stress: No Stress Concern Present (05/13/2023)   [employer] of Occupational Health - Occupational Stress Questionnaire    Feeling of Stress : Not at all  Social Connections: Unknown (05/13/2023)   Social Connection and Isolation Panel    Frequency of Communication with Friends and Family: Once a week    Frequency of Social Gatherings with Friends and Family: Not on file    Attends Religious Services: 1 to 4 times per year    Active Member of [employer] or Organizations: Yes    Attends Banker Meetings: 1 to 4 times per year    Marital Status: Widowed    Allergies  Allergen Reactions   Penicillins    Sulfa Antibiotics     Outpatient Medications Prior to Visit  Medication Sig Dispense Refill   ergocalciferol  (DRISDOL ) 1.25 MG (50000 UT) capsule Take 1 capsule (50,000 Units total) by mouth once a week. 12 capsule 0   ezetimibe  (ZETIA ) 10 MG tablet Take 1 tablet (10 mg total) by mouth daily. 90 tablet 3   letrozole  (FEMARA ) 2.5 MG tablet Take 1 tablet (2.5 mg total) by mouth daily. 90 tablet 3   Misc. Devices MISC Electric rollator, foldable. Diagnosis - lower extremity weakness 1 each 0   rosuvastatin  (CRESTOR ) 40 MG tablet Take 1 tablet (40 mg total) by mouth daily. 90 tablet 1   triamcinolone  cream (KENALOG ) 0.1 % APPLY TO THE AFFECTED AREA(S) TWICE DAILY 45 g 3   amLODipine  (NORVASC ) 5 MG tablet Take 1 tablet (5 mg total) by mouth daily. 90 tablet 3   No facility-administered medications prior to visit.     ROS Review of Systems  Constitutional:  Negative for activity change and appetite change.  HENT:  Negative for sinus pressure and sore throat.   Respiratory:  Negative for chest tightness, shortness of breath and wheezing.   Cardiovascular:  Positive for leg swelling. Negative for chest pain and palpitations.   Gastrointestinal:  Negative for abdominal distention, abdominal pain and constipation.  Genitourinary: Negative.   Musculoskeletal: Negative.   Psychiatric/Behavioral:  Negative for behavioral problems and dysphoric mood.     Objective:  BP 122/84   Pulse 100   Temp 98.8 F (37.1 C) (Oral)   Ht 4' 11 (1.499 m)   Wt 210 lb (95.3 kg)   BMI 42.41 kg/m      10/21/2023    9:07 AM 09/08/2023   10:13 AM 09/08/2023    9:38 AM  BP/Weight  Systolic BP 122 161 167  Diastolic BP 84 89 100  Wt. (Lbs) 210  205.2  BMI 42.41 kg/m2  41.45 kg/m2      Physical Exam  Constitutional:      Appearance: She is well-developed.   Cardiovascular:     Rate and Rhythm: Normal rate.     Heart sounds: Normal heart sounds. No murmur heard. Pulmonary:     Effort: Pulmonary effort is normal.     Breath sounds: Normal breath sounds. No wheezing or rales.  Chest:     Chest wall: No tenderness.  Abdominal:     General: Bowel sounds are normal. There is no distension.     Palpations: Abdomen is soft. There is no mass.     Tenderness: There is no abdominal tenderness.   Musculoskeletal:        General: Normal range of motion.     Right lower leg: Edema present.     Left lower leg: Edema present.   Neurological:     Mental Status: She is alert and oriented to person, place, and time.   Psychiatric:        Mood and Affect: Mood normal.        Latest Ref Rng & Units 09/08/2023   10:34 AM 05/13/2023    5:15 PM 08/28/2022   11:14 AM  CMP  Glucose 70 - 99 mg/dL 96  79  91   BUN 8 - 27 mg/dL 17  17  10    Creatinine 0.57 - 1.00 mg/dL 2.95  6.21  3.08   Sodium 134 - 144 mmol/L 145  149  145   Potassium 3.5 - 5.2 mmol/L 4.4  4.4  3.6   Chloride 96 - 106 mmol/L 110  112  106   CO2 20 - 29 mmol/L 20  23  23    Calcium  8.7 - 10.3 mg/dL 65.7  84.6  9.9   Total Protein 6.0 - 8.5 g/dL 7.0  6.9  6.8   Total Bilirubin 0.0 - 1.2 mg/dL 0.5  0.4  0.5   Alkaline Phos 44 - 121 IU/L 178  183  142   AST 0 - 40  IU/L 30  42  34   ALT 0 - 32 IU/L 35  42  25     Lipid Panel     Component Value Date/Time   CHOL 256 (H) 09/08/2023 1034   TRIG 99 09/08/2023 1034   HDL 47 09/08/2023 1034   CHOLHDL 4.7 (H) 01/30/2022 1020   CHOLHDL 4.3 03/17/2012 1712   VLDL 13 03/17/2012 1712   LDLCALC 192 (H) 09/08/2023 1034    CBC    Component Value Date/Time   WBC 5.1 06/10/2018 1030   RBC 4.46 06/10/2018 1030   HGB 13.5 06/10/2018 1030   HCT 41.0 06/10/2018 1030   PLT 180 06/10/2018 1030   MCV 91.9 06/10/2018 1030   MCH 30.3 06/10/2018 1030   MCHC 32.9 06/10/2018 1030   RDW 12.8 06/10/2018 1030   LYMPHSABS 1.3 06/10/2018 1030   MONOABS 0.5 06/10/2018 1030   EOSABS 0.1 06/10/2018 1030   BASOSABS 0.0 06/10/2018 1030    Lab Results  Component Value Date   HGBA1C 5.7 (A) 10/21/2023    Lab Results  Component Value Date   HGBA1C 5.7 (A) 10/21/2023   HGBA1C 5.8 (H) 05/13/2023   HGBA1C 5.7 05/13/2023      1. Prediabetes (Primary) Labs reveal prediabetes with an A1c of 5.7.  An A1c of 6.5 is supportive of a diagnosis of type 2 diabetes mellitus.  Working on a low carbohydrate diet, exercise, weight loss is recommended in order to prevent progression to type 2 diabetes mellitus.  -  POCT glycosylated hemoglobin (Hb A1C)  2. Primary hypertension Controlled Continue current regimen Counseled on blood pressure goal of less than 130/80, low-sodium, DASH diet, medication compliance, 150 minutes of moderate intensity exercise per week. Discussed medication compliance, adverse effects. - amLODipine  (NORVASC ) 5 MG tablet; Take 1 tablet (5 mg total) by mouth daily.  Dispense: 90 tablet; Refill: 3  3. Hyperlipidemia, unspecified hyperlipidemia type Uncontrolled from last set of labs Currently on Crestor  and Zetia  was added at the last set of labs Lipid panel at next visit  4. Pedal edema Encouraged to comply with a low-sodium diet, elevate feet, use compression stockings Amlodipine  could be  contributing   Meds ordered this encounter  Medications   amLODipine  (NORVASC ) 5 MG tablet    Sig: Take 1 tablet (5 mg total) by mouth daily.    Dispense:  90 tablet    Refill:  3    Follow-up: Return in about 6 months (around 04/21/2024) for Chronic medical conditions.       Joaquin Mulberry, MD, FAAFP. [facility] [place], [place] [phone]   10/21/2023, 9:42 AM

## 2023-10-22 NOTE — Telephone Encounter (Signed)
 HCPCS code that is seems most appropriate for patient's requested device: [phone]- power operated vehicle, not otherwise classified.   I called [employer] for PA: [phone] and spoke to Bearl Botts - ref # [id number]  She reported that the code [phone] requires a PA and she said she is in claims and she needs to transfer me to Clinical Intake.  She was able to check patient's records that they have and she said that they received medical record 09/18/2023 and no additional medical records are needed  She then disconnected our call as she was transferring me to Clinical Intake.    I called [employer] back again: [phone] and spoke to Isa Manuel and explained again that I need a PA.  She then transferred me to Sarah/ Clinical Intake Team who then collected the necessary information to start the PA request.  I provided her with the HCPCS code: [phone] and diagnosis code: R53.1-lower extremeity weakness.  She said that the PA request is still pending and they are asking for patient's medical records.  She could not find any medical records that the prior Isa Manuel told me they had.   She said we can send the most recent visit note from face to face encounter with PCP  (10/11/2023) that includes H&P and weight.  She then said that the note needs to include a description of the patient's mobility limitations that significantly impair her ability to perform her ADLS.  She recommended  that we should just fax what we have to them for review and the clinical intake review nurse will call this clinic with any questions. Fax: [phone].  Ref # [id number] is to be included on fax cover sheet.

## 2023-10-22 NOTE — Telephone Encounter (Signed)
 I spoke to [employer] and she does not have a CPT code for this device

## 2023-10-22 NOTE — Telephone Encounter (Signed)
 I called [employer]: [phone] and spoke to [employer] regarding the need for a prior auth for this wheelator. He transferred me to Stacey/ Provider services who then transferred me to Joletta/ Clinical Intake Team. She said she would need to transfer me to the  [facility] for prior auth because the account is locked.  While transferring the call, she disconnected me. This call took 20+ minutes.  I called [employer] back and spoke to [place] who transferred me to Angel/ Clinical Intake Team who then transferred me to [employer] Team who transferred me to Lucy/Clinical Intake Specialist.   I explained the reason behind this request for PA - The patient is requesting this for a piece of equipment that she just purchased and was told by [facility] that even if the PA is denied, she can submit her receipts for the product and will be reimbursed at least 80 %.  Macky Sayres explained that we need a CPT code to submit a PA.  She said I can call the PA department [phone] back  when I have a CPT code : .  I called [employer]: [phone] to inquire about a CPT code and had to leave a message requesting a call back.

## 2023-10-23 NOTE — Telephone Encounter (Signed)
 Dr Jennet Mode  office notes from visits on 09/08/2023 and 10/21/2023 as well as the prescription for the wheelator were faxed to [facility] Clinical Intake Review as requested: [phone].   I called the patient provided her with an update on the status of the PA.  I informed her that the PA was started yesterday and I just faxed the documents they requested and I reminded her that there is no guarantee that this will be approved. She was very appreciative of the update.

## 2023-10-23 NOTE — Telephone Encounter (Signed)
 You can use my note from 09/08/2023 as he does have the documentation justifying the need for a walker.Thanks

## 2023-10-28 ENCOUNTER — Telehealth: Payer: Self-pay | Admitting: Family Medicine

## 2023-10-28 NOTE — Telephone Encounter (Signed)
 Copied from CRM [id number]. Topic: Clinical - Medication Question >> Oct 28, 2023 10:09 AM Carlatta H wrote:  Reason for CRM: Nurse Slater please contact Caitlyn Mccoy//[id number] power operated wheelchair /She sent a fax on 6/23 on what was missing//She needs it returned on tomorrow byy 12:15//Needs to know why the member is unable to use a manual wheelchair//Can the member move on and over the wheelchair, maintain posture stability in position while operating the scooter//Does she have the mental capabilities to use//What DME//Fax [phone] please call if needed [place] [phone]

## 2023-11-04 NOTE — Telephone Encounter (Signed)
 Call returned to Randall, Clinical Nurse with [facility] and I had to leave a message explaining that I just received her message. I was out of the office when she called and today is my first day back.  I informed her that I did not receive a fax from [facility] for this patient. I provided my call back number and also my email address and requested that she email me the document that she is referring to

## 2023-11-04 NOTE — Telephone Encounter (Signed)
 This conversation is continued in another telephone encounter

## 2023-11-05 NOTE — Telephone Encounter (Signed)
 Copied from CRM [id number]. Topic: General - Call Back - No Documentation >> Nov 05, 2023 10:39 AM Wess RAMAN wrote:  Reason for CRM: Patient is requesting a callback to speak about her power operated wheelchair from Marvis Bradley, Charity fundraiser.  Callback #: [phone]

## 2023-11-05 NOTE — Telephone Encounter (Signed)
 Caitlyn Mccoy, PAA was able to locate the faxes from [facility] and I have given those to Dr Newlin for review.  I called the patient and had to leave a message requesting a call back.

## 2023-11-05 NOTE — Telephone Encounter (Signed)
 Copied from CRM [id number]. Topic: General - Call Back - No Documentation >> Nov 05, 2023 10:39 AM Wess RAMAN wrote:  Reason for CRM: Patient is requesting a callback to speak about her power operated wheelchair from Marvis Bradley, Charity fundraiser.  Callback #: [phone] >> Nov 05, 2023  2:36 PM Montie POUR wrote:  I read Francess this message: Call returned to Randall, Clinical Nurse with [facility] and I had to leave a message explaining that I just received her message. I was out of the office when she called and today is my first day back. I informed her that I did not receive a fax from [facility] for this patient. I provided my call back number and also my email address and requested that she email me the document that she is referring to  Please let Kyria know when everything has been sent to [facility]. Thanks

## 2023-11-05 NOTE — Telephone Encounter (Signed)
 I called the patient back and wanted to know if we heard anything from [facility].  I explained to her that we submitted the last 2 office visit notes from her appointments with Dr Newlin and we received a fax requesting additional information and then a notice of coverage denial.  I told her that I shared this information with Dr Delbert to review. I also explained that I am not sure if we will be able to provide the information that is being requested and she said she understood.  She stated that she has received the motorized wheelchair walker and is very pleased with it and believes it will help her with her mobility issues.

## 2023-12-30 ENCOUNTER — Encounter

## 2024-01-01 ENCOUNTER — Ambulatory Visit
Admission: RE | Admit: 2024-01-01 | Discharge: 2024-01-01 | Disposition: A | Discharge status: Home / Self Care | Source: Ambulatory Visit | Attending: Hematology and Oncology | Admitting: Hematology and Oncology

## 2024-01-01 ENCOUNTER — Other Ambulatory Visit: Payer: Self-pay | Admitting: Hematology and Oncology

## 2024-01-01 DIAGNOSIS — R921 Mammographic calcification found on diagnostic imaging of breast: Secondary | ICD-10-CM

## 2024-01-01 HISTORY — DX: Personal history of irradiation: Z92.3

## 2024-01-02 ENCOUNTER — Ambulatory Visit: Payer: Self-pay | Admitting: Family Medicine

## 2024-02-09 ENCOUNTER — Other Ambulatory Visit (HOSPITAL_COMMUNITY)
Admission: RE | Admit: 2024-02-09 | Discharge: 2024-02-09 | Disposition: A | Discharge status: Home / Self Care | Source: Other Acute Inpatient Hospital | Attending: Obstetrics | Admitting: Obstetrics

## 2024-02-09 ENCOUNTER — Encounter: Payer: Self-pay | Admitting: Obstetrics

## 2024-02-09 ENCOUNTER — Ambulatory Visit: Admitting: Obstetrics

## 2024-02-09 VITALS — BP 121/76 | HR 75 | Ht <= 58 in | Wt 203.4 lb

## 2024-02-09 DIAGNOSIS — R8290 Unspecified abnormal findings in urine: Secondary | ICD-10-CM | POA: Insufficient documentation

## 2024-02-09 DIAGNOSIS — K5900 Constipation, unspecified: Secondary | ICD-10-CM | POA: Insufficient documentation

## 2024-02-09 DIAGNOSIS — N952 Postmenopausal atrophic vaginitis: Secondary | ICD-10-CM | POA: Diagnosis not present

## 2024-02-09 DIAGNOSIS — N3941 Urge incontinence: Secondary | ICD-10-CM | POA: Insufficient documentation

## 2024-02-09 DIAGNOSIS — R3914 Feeling of incomplete bladder emptying: Secondary | ICD-10-CM | POA: Diagnosis not present

## 2024-02-09 LAB — URINALYSIS, ROUTINE W REFLEX MICROSCOPIC
Bacteria, UA: NONE SEEN
Bilirubin Urine: NEGATIVE
Glucose, UA: NEGATIVE mg/dL
Ketones, ur: NEGATIVE mg/dL
Leukocytes,Ua: NEGATIVE
Nitrite: NEGATIVE
Protein, ur: NEGATIVE mg/dL
Specific Gravity, Urine: 1.008 (ref 1.005–1.030)
pH: 7 (ref 5.0–8.0)

## 2024-02-09 LAB — POCT URINALYSIS DIP (CLINITEK)
Bilirubin, UA: NEGATIVE
Bilirubin, UA: NEGATIVE
Glucose, UA: NEGATIVE mg/dL
Glucose, UA: NEGATIVE mg/dL
Ketones, POC UA: NEGATIVE mg/dL
Ketones, POC UA: NEGATIVE mg/dL
Leukocytes, UA: NEGATIVE
Nitrite, UA: NEGATIVE
Nitrite, UA: NEGATIVE
POC PROTEIN,UA: 30 — AB
POC PROTEIN,UA: NEGATIVE
Spec Grav, UA: 1.02 (ref 1.010–1.025)
Spec Grav, UA: 1.015 (ref 1.010–1.025)
Urobilinogen, UA: 0.2 U/dL
Urobilinogen, UA: 0.2 U/dL
pH, UA: 5.5 (ref 5.0–8.0)
pH, UA: 7 (ref 5.0–8.0)

## 2024-02-09 MED ORDER — GEMTESA 75 MG PO TABS: 75.0000 mg | ORAL_TABLET | Freq: Every day | ORAL | 2 refills | Status: DC

## 2024-02-09 MED ORDER — GEMTESA 75 MG PO TABS: 75.0000 mg | ORAL_TABLET | Freq: Every day | ORAL | Status: DC

## 2024-02-09 NOTE — Assessment & Plan Note (Addendum)
-   POCT UA + leuk/heme, PVR 43mL. Pending catheterized UA micro and culture - We discussed the symptoms of overactive bladder (OAB), which include urinary urgency, urinary frequency, nocturia, with or without urge incontinence.  While we do not know the exact etiology of OAB, several treatment options exist. We discussed management including behavioral therapy (decreasing bladder irritants, urge suppression strategies, timed voids, bladder retraining), physical therapy, medication; for refractory cases posterior tibial nerve stimulation, sacral neuromodulation, and intravesical botulinum toxin injection.  For anticholinergic medications, we discussed the potential side effects of anticholinergics including dry eyes, dry mouth, constipation, cognitive impairment and urinary retention. For Beta-3 agonist medication, we discussed the potential side effect of elevated blood pressure which is more likely to occur in individuals with uncontrolled hypertension. - discussed fluid management and weight reduction - possible functional incontinence due to limitations in mobility, consider bedside commode - samples and Rx for trial of Gemtesa, encouraged pt to call if cost prohibitive - desires to avoid surgical or procedural interventions - consider urodynamics due to history of CVA with risk of neurogenic bladder

## 2024-02-09 NOTE — Patient Instructions (Addendum)
 We discussed the symptoms of overactive bladder (OAB), which include urinary urgency, urinary frequency, night-time urination, with or without urge incontinence.  We discussed management including behavioral therapy (decreasing bladder irritants by following a bladder diet, urge suppression strategies, timed voids, bladder retraining), physical therapy, medication; and for refractory cases posterior tibial nerve stimulation, sacral neuromodulation, and intravesical botulinum toxin injection.   For Beta-3 agonist medication, we discussed the potential side effect of elevated blood pressure which is more likely to occur in individuals with uncontrolled hypertension. You were given samples for Gemtesa 75 mg.  It can take a month to start working so give it time, but if you have bothersome side effects call sooner and we can try a different medication.  Call us  if you have trouble filling the prescription or if it's not covered by your insurance.  Constipation: Our goal is to achieve formed bowel movements daily or every-other-day.  You may need to try different combinations of the following options to find what works best for you - everybody's body works differently so feel free to adjust the dosages as needed.  Some options to help maintain bowel health include:  Dietary changes (more leafy greens, vegetables and fruits; less processed foods) Fiber supplementation (Benefiber, FiberCon, Metamucil or Psyllium). Start slow and increase gradually to full dose. Over-the-counter agents such as: stool softeners (Docusate or Colace) and/or laxatives (Miralax, milk of magnesia)  Power Pudding is a natural mixture that may help your constipation.  To make blend 1 cup applesauce, 1 cup wheat bran, and 3/4 cup prune juice, refrigerate and then take 1 tablespoon daily with a large glass of water as needed.   Women should try to eat at least 21 to 25 grams of fiber a day, while men should aim for 30 to 38 grams a  day. You can add fiber to your diet with food or a fiber supplement such as psyllium (metamucil), benefiber, or fibercon.   Here's a look at how much dietary fiber is found in some common foods. When buying packaged foods, check the Nutrition Facts label for fiber content. It can vary among brands.  Fruits Serving size Total fiber (grams)*  Raspberries 1 cup 8.0  Pear 1 medium 5.5  Apple, with skin 1 medium 4.5  Banana 1 medium 3.0  Orange 1 medium 3.0  Strawberries 1 cup 3.0   Vegetables Serving size Total fiber (grams)*  Green peas, boiled 1 cup 9.0  Broccoli, boiled 1 cup chopped 5.0  Turnip greens, boiled 1 cup 5.0  Brussels sprouts, boiled 1 cup 4.0  Potato, with skin, baked 1 medium 4.0  Sweet corn, boiled 1 cup 3.5  Cauliflower, raw 1 cup chopped 2.0  Carrot, raw 1 medium 1.5   Grains Serving size Total fiber (grams)*  Spaghetti, whole-wheat, cooked 1 cup 6.0  Barley, pearled, cooked 1 cup 6.0  Bran flakes 3/4 cup 5.5  Quinoa, cooked 1 cup 5.0  Oat bran muffin 1 medium 5.0  Oatmeal, instant, cooked 1 cup 5.0  Popcorn, air-popped 3 cups 3.5  Brown rice, cooked 1 cup 3.5  Bread, whole-wheat 1 slice 2.0  Bread, rye 1 slice 2.0   Legumes, nuts and seeds Serving size Total fiber (grams)*  Split peas, boiled 1 cup 16.0  Lentils, boiled 1 cup 15.5  Black beans, boiled 1 cup 15.0  Baked beans, canned 1 cup 10.0  Chia seeds 1 ounce 10.0  Almonds 1 ounce (23 nuts) 3.5  Pistachios 1 ounce (49 nuts) 3.0  Sunflower kernels 1 ounce 3.0  *Rounded to nearest 0.5 gram. Source: [employer] for [employer]	[place]    - discussed proper vulvar care, warm compression, avoid pad use, cotton only underwear and barrier ointment if needed  - encouraged Vit E suppository, moisturizer with Replens/Revaree

## 2024-02-09 NOTE — Assessment & Plan Note (Signed)
-   discussed proper vulvar care, warm compression, avoid pad use, cotton only underwear and barrier ointment if needed  - encouraged Vit E suppository, moisturizer with Replens/Revaree - will avoid vaginal estrogen as 1st line treatment due to history of breast cancer on letrozole

## 2024-02-09 NOTE — Assessment & Plan Note (Addendum)
-   POCT UA + leuk/heme, catheterized UA microscopy and culture pending - denies gross hematuria - history of 10 pack years - For management of microscopic hematuria, we discussed the importance of work-up including assessing the upper and lower GU tract with CT urogram and cystoscopy if repeat testing is positive. She will pursue this work-up and follow-up afterward to discuss the results and decide on a treatment plan based on the findings.  - Cr 1.14 in 09/08/23 - encouraged to avoid antibiotic treatment in the absence of UTI symptoms

## 2024-02-09 NOTE — Progress Notes (Signed)
 New Patient Evaluation and Consultation  Referring Provider: Armond Cape, MD PCP: Delbert Clam, MD Date of Service: 02/09/2024  SUBJECTIVE Chief Complaint: New Patient (Initial Visit) Caitlyn Mccoy is a 73 y.o. female here today for evaluation of a cystocele.)  History of Present Illness: Caitlyn Mccoy is a 73 y.o. Black or African-American female seen in consultation at the request of Dr Armond for evaluation of urethral prolapse and incomplete bladder emptying.    Urethral prolapse noted on exam by Dr. Armond, denies symptoms of blood in urine, dysuria.  Sensation of incomplete emptying started March or April with intermittent straining of bowel movements. Reports hard bowel movement in the past week with decreased intake of vegetables Denies changes in medications, surgery, falls, or constipation at onset of symptoms.  Reports fluctuations in weight Pending motorized wheelchair, ambulates with walker Urine culture 08/20/23 with 10-25K cfu pansensitive E. Coli without symptoms, treated with Cipro by Dr. Armond without change in urinary symptoms.  Review of records significant for: Right breast cancer s/p lumpectomy and radiation on letrozole  with calcification in upper inner right breast on 12/29/23 mammogram pending follow-up, Pre-DM, left eye blindness, history of CVA  Urinary Symptoms: Leaks urine with going from sitting to standing, with a full bladder, with movement to the bathroom, with urgency, and without sensation started since 5-7 months ago Leaks 4 time(s) per weeks with urgency Pad use: changes 2 pad over adult diapers per day.  Leaks onto diaper 3-4x/week when she increases fluid intake Patient is bothered by UI symptoms.  Day time voids 8.  Nocturia: 0 times per night to void, sleeps around 4 hours Reports leg swelling, reports compression socks use and elevates her feet Reports snoring, denies diagnosis sleep apnea with negative sleep study Voiding  dysfunction:  empties bladder well.  Patient does not use a catheter to empty bladder.  When urinating, patient feels the need to urinate multiple times in a row Drinks: 64oz water per day, 8oz juice, intermittent 16oz tea 2-3x/week, 12oz ginger ale soda 5x/week  UTIs: 0 UTI's in the last year.   Denies history of blood in urine, kidney or bladder stones, pyelonephritis, bladder cancer, and kidney cancer No results found for the last 90 days.   Pelvic Organ Prolapse Symptoms:                  Patient Denies a feeling of a bulge the vaginal area. It has been present for 5 months. Reports sensation of gas in her vagina Patient Denies seeing a bulge.  This bulge is not bothersome.  Bowel Symptom: Bowel movements: 3-4 time(s) per week Stool consistency: hard or soft , Type IV mostly, intermittent Type II Straining: yes, intermittently when she decreases vegetables in her diet Splinting: no.  Incomplete evacuation: no. Intermittently per pt Patient Denies accidental bowel leakage / fecal incontinence Bowel regimen: diet Last colonoscopy: Results no report for review [facility] Colon[facility]          Upcoming     Colonoscopy (Every 10 Years) Next due on 05/06/2025    05/07/2015  Done   Only the first 1 history entries have been loaded, but more history exists.                Sexual Function Sexually active: no.  Sexual orientation: Straight Pain with sex: No  Pelvic Pain Denies pelvic pain   Past Medical History:  Past Medical History:  Diagnosis Date  . Blindness    left eye since birth  .  Breast cancer (HCC) 06/2018   right breast ca  . Breast cancer of upper-inner quadrant of right female breast (HCC) 06/12/2018  . GERD (gastroesophageal reflux disease)   . Hiatal hernia   . History of cervical dysplasia    in her 15s, underwent partial cervix removal and cryosurgery  . Hypertension   . Personal history of radiation therapy   . Stroke [facility]) 2000   denies deficits   . Yeast infection      Past Surgical History:   Past Surgical History:  Procedure Laterality Date  . BREAST LUMPECTOMY Right 06/2018  . BREAST LUMPECTOMY WITH RADIOACTIVE SEED AND SENTINEL LYMPH NODE BIOPSY Right 06/12/2018   Procedure: RIGHT BREAST LUMPECTOMY WITH RADIOACTIVE SEED AND  RIGHT AXILLARY  DEEP SENTINEL LYMPH NODE BIOPSY WITH BLUE DYE INJECTION ERAS PATHWAY;  Surgeon: Gail Favorite, MD;  Location: [facility];  Service: General;  Laterality: Right;  PECTORAL BLOCK  . CAROTID ENDARTERECTOMY  2001  . TONSILLECTOMY    . WISDOM TOOTH EXTRACTION       Past OB/GYN History: OB History  Gravida Para Term Preterm AB Living  5 1  1 3 1   SAB IAB Ectopic Multiple Live Births  3    2    # Outcome Date GA Lbr Len/2nd Weight Sex Type Anes PTL Lv  5 Preterm     F Vag-Spont   LIV  4 Gravida         ND  3 SAB           2 SAB           1 SAB             Vaginal deliveries: largest infant 2lbs,  Forceps/ Vacuum deliveries: 0, Cesarean section: 0 Son passed away after 2 hours at 24 weeks Menopausal: Yes, at age early 36s, Denies vaginal bleeding since menopause Contraception: s/p menopause. Last pap smear was 08/26/22 NILM neg HPV.  Any history of abnormal pap smears: yes. History of cervical dysplasia in her 50s and underwent cryo No results found for: DIAGPAP, HPVHIGH, ADEQPAP  Medications: Patient has a current medication list which includes the following prescription(s): amlodipine , letrozole , misc. devices, misc. devices, rosuvastatin , triamcinolone  cream, gemtesa, and gemtesa.   Allergies: Patient is allergic to penicillins and sulfa antibiotics.   Social History:  Social History   Tobacco Use  . Smoking status: Former    Current packs/day: 0.00    Average packs/day: 0.5 packs/day for 20.0 years (10.0 ttl pk-yrs)    Types: Cigarettes    Start date: 06/30/1969    Quit date: 06/30/1989    Years since quitting: 34.6  . Smokeless tobacco: Never   Vaping Use  . Vaping status: Never Used  Substance Use Topics  . Alcohol use: No  . Drug use: No    Relationship status: single Patient lives alone.   Patient is not employed. Regular exercise: No History of abuse: No  Family History:   Family History  Problem Relation Age of Onset  . Diabetes Mother   . Heart attack Father   . Breast cancer Neg Hx   . Bladder Cancer Neg Hx   . Renal cancer Neg Hx   . Uterine cancer Neg Hx      Review of Systems: Review of Systems  Constitutional:  Positive for malaise/fatigue. Negative for fever and weight loss.       Weight gain  Respiratory:  Negative for cough, shortness of breath and wheezing.  Cardiovascular:  Positive for leg swelling. Negative for chest pain and palpitations.  Gastrointestinal:  Negative for abdominal pain, blood in stool and constipation.  Genitourinary:  Positive for frequency and urgency. Negative for dysuria and hematuria.       Leakage  Skin:  Negative for rash.  Neurological:  Negative for dizziness, weakness and headaches.  Endo/Heme/Allergies:  Does not bruise/bleed easily.  Psychiatric/Behavioral:  Negative for depression. The patient is not nervous/anxious.      OBJECTIVE Physical Exam: Vitals:   02/09/24 0816  BP: 121/76  Pulse: 75  Weight: 203 lb 6.4 oz (92.3 kg)  Height: 4' 10 (1.473 m)    Physical Exam Constitutional:      General: She is not in acute distress.    Appearance: Normal appearance.  Genitourinary:     Bladder and urethral meatus normal.     No lesions in the vagina.     Genitourinary Comments: Perianal light brown stool noted     Right Labia: No rash, tenderness, lesions, skin changes or Bartholin's cyst.    Left Labia: No tenderness, lesions, skin changes, Bartholin's cyst or rash.    No vaginal discharge, erythema, tenderness, bleeding, ulceration or granulation tissue.     No vaginal prolapse present.    Severe vaginal atrophy present.     Right Adnexa: not  tender, not full and no mass present.    Left Adnexa: not tender, not full and no mass present.    No cervical motion tenderness, discharge, friability, lesion, polyp or nabothian cyst.     Uterus is not enlarged, fixed, tender, irregular or prolapsed.     No uterine mass detected.    Urethral meatus caruncle not present.    No urethral prolapse, tenderness, mass, hypermobility, discharge or stress urinary incontinence with cough stress test present.     Bladder is not tender, urgency on palpation not present and masses not present.      Pelvic Floor: Levator muscle strength is 4/5.    Levator ani not tender, obturator internus not tender, no asymmetrical contractions present and no pelvic spasms present.    Pelvic Floor comments: High tone pelvic floor.    Symmetrical pelvic sensation, anal wink present and BC reflex present. Cardiovascular:     Rate and Rhythm: Normal rate.  Pulmonary:     Effort: Pulmonary effort is normal. No respiratory distress.  Abdominal:     General: There is no distension.     Palpations: Abdomen is soft. There is no mass.     Tenderness: There is no abdominal tenderness.     Hernia: No hernia is present.  Neurological:     Mental Status: She is alert.  Vitals reviewed. Exam conducted with a chaperone present.      POP-Q:   POP-Q  -3                                            Aa   -3                                           Ba  -8  C   1                                            Gh  4                                            Pb  8                                            tvl   -3                                            Ap  -3                                            Bp  -8                                              D     Post-Void Residual (PVR) by Bladder Scan: In order to evaluate bladder emptying, we discussed obtaining a postvoid residual and patient agreed to this  procedure.  Procedure: The ultrasound unit was placed on the patient's abdomen in the suprapubic region after the patient had voided.    Post Void Residual - 02/09/24 0842       Post Void Residual   Post Void Residual 43 mL         Straight Catheterization Procedure for PVR: After verbal consent was obtained from the patient for catheterization to assess bladder emptying and residual volume the urethra and surrounding tissues were prepped with betadine and an in and out catheterization was performed.  PVR was .  Urine appeared clear yellow. The patient tolerated the procedure well.  Laboratory Results: Lab Results  Component Value Date   COLORU yellow 02/09/2024   CLARITYU clear 02/09/2024   GLUCOSEUR negative 02/09/2024   BILIRUBINUR negative 02/09/2024   SPECGRAV 1.015 02/09/2024   RBCUR small (A) 02/09/2024   PHUR 7.0 02/09/2024   PROTEINUR NEGATIVE 02/09/2024   UROBILINOGEN 0.2 02/09/2024   LEUKOCYTESUR Negative 02/09/2024    Lab Results  Component Value Date   CREATININE 1.14 (H) 09/08/2023   CREATININE 1.07 (H) 05/13/2023   CREATININE 1.05 (H) 08/28/2022    Lab Results  Component Value Date   HGBA1C 5.7 (A) 10/21/2023    Lab Results  Component Value Date   HGB 13.5 06/10/2018     ASSESSMENT AND PLAN Caitlyn Mccoy is a 73 y.o. with:  1. Urge urinary incontinence   2. Constipation, unspecified constipation type   3. Vaginal atrophy   4. Abnormal urinalysis   5. Feeling of incomplete bladder emptying     Urge urinary incontinence Assessment & Plan: - POCT UA + leuk/heme, PVR 43mL. Pending catheterized UA micro and culture -  We discussed the symptoms of overactive bladder (OAB), which include urinary urgency, urinary frequency, nocturia, with or without urge incontinence.  While we do not know the exact etiology of OAB, several treatment options exist. We discussed management including behavioral therapy (decreasing bladder irritants, urge suppression  strategies, timed voids, bladder retraining), physical therapy, medication; for refractory cases posterior tibial nerve stimulation, sacral neuromodulation, and intravesical botulinum toxin injection.  For anticholinergic medications, we discussed the potential side effects of anticholinergics including dry eyes, dry mouth, constipation, cognitive impairment and urinary retention. For Beta-3 agonist medication, we discussed the potential side effect of elevated blood pressure which is more likely to occur in individuals with uncontrolled hypertension. - discussed fluid management and weight reduction - possible functional incontinence due to limitations in mobility, consider bedside commode - samples and Rx for trial of Gemtesa, encouraged pt to call if cost prohibitive - desires to avoid surgical or procedural interventions - consider urodynamics due to history of CVA with risk of neurogenic bladder  Orders: -     POCT URINALYSIS DIP (CLINITEK) -     Gemtesa; Take 1 tablet (75 mg total) by mouth daily.  Dispense: 14 tablet -     Gemtesa; Take 1 tablet (75 mg total) by mouth daily.  Dispense: 30 tablet; Refill: 2  Constipation, unspecified constipation type Assessment & Plan: - fecal smearing noted on exam, denies incontinence symptoms - For constipation, we reviewed the importance of a better bowel regimen.  We also discussed the importance of avoiding chronic straining, as it can exacerbate her pelvic floor symptoms; we discussed treating constipation and straining prior to surgery, as postoperative straining can lead to damage to the repair and recurrence of symptoms. We discussed initiating therapy with increasing fluid intake, fiber supplementation, stool softeners, and laxatives such as miralax.  - encouraged titration of fiber supplementation and squatting position  - discussed association with pelvic floor disorders   Vaginal atrophy Assessment & Plan: - discussed proper vulvar care,  warm compression, avoid pad use, cotton only underwear and barrier ointment if needed  - encouraged Vit E suppository, moisturizer with Replens/Revaree - will avoid vaginal estrogen as 1st line treatment due to history of breast cancer on letrozole    Abnormal urinalysis Assessment & Plan: - POCT UA + leuk/heme, catheterized UA microscopy and culture pending - denies gross hematuria - history of 10 pack years - For management of microscopic hematuria, we discussed the importance of work-up including assessing the upper and lower GU tract with CT urogram and cystoscopy if repeat testing is positive. She will pursue this work-up and follow-up afterward to discuss the results and decide on a treatment plan based on the findings.  - Cr 1.14 in 09/08/23 - encouraged to avoid antibiotic treatment in the absence of UTI symptoms   Orders: -     POCT URINALYSIS DIP (CLINITEK) -     Urine Microscopic; Future -     Urine Culture; Future  Feeling of incomplete bladder emptying Assessment & Plan: - PVR 43mL, catheterized for WNL - repeat if clinical change after Gemtesa   Time spent: I spent 60 minutes dedicated to the care of this patient on the date of this encounter to include pre-visit review of records, face-to-face time with the patient discussing urgency urinary incontinence vaginal atrophy, constipation, abnormal urinalysis, and post visit documentation and ordering medication/ testing.    Lianne ONEIDA Gillis, MD

## 2024-02-09 NOTE — Assessment & Plan Note (Signed)
-   PVR 43mL, catheterized for WNL - repeat if clinical change after Gemtesa

## 2024-02-09 NOTE — Assessment & Plan Note (Addendum)
-   fecal smearing noted on exam, denies incontinence symptoms - For constipation, we reviewed the importance of a better bowel regimen.  We also discussed the importance of avoiding chronic straining, as it can exacerbate her pelvic floor symptoms; we discussed treating constipation and straining prior to surgery, as postoperative straining can lead to damage to the repair and recurrence of symptoms. We discussed initiating therapy with increasing fluid intake, fiber supplementation, stool softeners, and laxatives such as miralax.  - encouraged titration of fiber supplementation and squatting position  - discussed association with pelvic floor disorders

## 2024-02-10 ENCOUNTER — Ambulatory Visit: Payer: Self-pay | Admitting: Obstetrics

## 2024-02-10 ENCOUNTER — Telehealth: Payer: Self-pay

## 2024-02-10 LAB — URINE CULTURE: Culture: NO GROWTH

## 2024-02-10 NOTE — Telephone Encounter (Signed)
 Patient called to inform you, her medication cost will be $100 dollars for a 90 day supply.  She said she will stick with this prescription since it's mostly affordable.

## 2024-04-21 ENCOUNTER — Ambulatory Visit: Payer: Self-pay | Admitting: Family Medicine

## 2024-04-21 ENCOUNTER — Encounter: Payer: Self-pay | Admitting: Family Medicine

## 2024-04-21 ENCOUNTER — Ambulatory Visit: Admitting: Family Medicine

## 2024-04-21 VITALS — BP 153/82 | HR 86 | Temp 98.3°F | Ht <= 58 in | Wt 203.0 lb

## 2024-04-21 DIAGNOSIS — C50211 Malignant neoplasm of upper-inner quadrant of right female breast: Secondary | ICD-10-CM

## 2024-04-21 DIAGNOSIS — N3941 Urge incontinence: Secondary | ICD-10-CM | POA: Diagnosis not present

## 2024-04-21 DIAGNOSIS — Z170 Estrogen receptor positive status [ER+]: Secondary | ICD-10-CM | POA: Diagnosis not present

## 2024-04-21 DIAGNOSIS — I10 Essential (primary) hypertension: Secondary | ICD-10-CM | POA: Diagnosis not present

## 2024-04-21 DIAGNOSIS — R6889 Other general symptoms and signs: Secondary | ICD-10-CM

## 2024-04-21 DIAGNOSIS — E785 Hyperlipidemia, unspecified: Secondary | ICD-10-CM | POA: Diagnosis not present

## 2024-04-21 DIAGNOSIS — R7303 Prediabetes: Secondary | ICD-10-CM | POA: Diagnosis not present

## 2024-04-21 LAB — POCT GLYCOSYLATED HEMOGLOBIN (HGB A1C): HbA1c, POC (prediabetic range): 5.9 % (ref 5.7–6.4)

## 2024-04-21 MED ORDER — AMLODIPINE BESYLATE 5 MG PO TABS: 5.0000 mg | ORAL_TABLET | Freq: Every day | ORAL | 1 refills | Status: AC

## 2024-04-21 NOTE — Patient Instructions (Signed)
 VISIT SUMMARY:  Today, we reviewed your blood pressure, prediabetes, cholesterol, incontinence, breast cancer treatment, and cold intolerance. We discussed your current medications and any concerns you have about them.  YOUR PLAN:  -PRIMARY HYPERTENSION: Your blood pressure was elevated today, which might be due to the stress you experienced. We will continue your current dose of amlodipine  and recheck your blood pressure before you leave. If your blood pressure remains high at other visits, we may need to adjust your medication.  -PREDIABETES: Your A1c level has increased slightly, indicating that your prediabetes is worsening. It's important to be cautious with your diet, especially during the holidays, to help manage your blood sugar levels.  -HYPERLIPIDEMIA: Your cholesterol levels have been high, and we increased your dose of Crestor . We need to repeat your lipid panel to check your cholesterol levels again.  SHARLYN URINARY INCONTINENCE: You are managing your incontinence with Gemtesa , which is working well for you. However, you are concerned about the cost and potential cognitive side effects of alternative medications. Continue taking Gemtesa  and discuss any cost concerns or alternatives with your urologist.  -ESTROGEN RECEPTOR POSITIVE MALIGNANT NEOPLASM OF RIGHT BREAST: You are taking letrozole  for breast cancer, and you have reported leg weakness, which might be related to the medication. Continue taking letrozole  and follow up with the cancer center for your mammogram in February and your annual visit in April.  -COLD INTOLERANCE: You have been feeling cold all the time. We will check your thyroid  function and iron levels to see if there is an underlying cause.  INSTRUCTIONS:  Please schedule a visit if your blood pressure remains elevated at other specialist visits. Follow up with the cancer center for your mammogram in February and your annual visit in April. We will also need to  repeat your lipid panel to check your cholesterol levels again.

## 2024-04-21 NOTE — Progress Notes (Addendum)
 "  Subjective:  Patient ID: Caitlyn Mccoy, female    DOB: 1950-05-20  Age: 73 y.o. MRN: [id number]  CC: Medical Management of Chronic Issues     Discussed the use of AI scribe software for clinical note transcription with the patient, who gave verbal consent to proceed.  History of Present Illness Caitlyn Mccoy is a 73 year old female a history of previous CVA, hypertension, hyperlipidemia, Right breast ca ( diagnosed 2020 s/p lumpectomy, adjuvant radiation, adjuvant antiestrogen therapy) on Femara  , left eye visual loss, thyroid  nodule (radioactive uptake scan in 2005) with hypertension and prediabetes who presents for follow-up of her blood pressure and medical conditions.  She is taking amlodipine  for hypertension. She felt acutely agitated today due to a transportation scheduling problem, which she thinks may have raised her blood pressure. Recent readings at other visits were 121/76 and 122/84.  She is managing prediabetes, with recent A1c of 5.9 after prior values of 5.7 and 5.8.  She takes letrozole  for breast cancer and has been on it for five years. Her oncologist extended therapy for two more years. She reports leg weakness and fatigue, especially after sitting and with first steps, and uses a wheelator and cane for mobility.  The wheelator was not approved by her insurance company but she paid out-of-pocket and is satisfied that she can use it both as a walker and wheelchair.  She takes Gemtesa  for incontinence with good effect. She is concerned about its cost and mentions her urologist advised that some alternative medications may affect cognition.  She feels cold all the time and asks to check iron and thyroid . Prior thyroid  testing has been normal, and she asks about testing for Hashimoto's disease.  She has elevated cholesterol and her dose of Crestor  has been increased after her last set of labs.  She did not eat breakfast before today's visit. She is also needing PCS  services to assist with her ADLs.     Past Medical History:  Diagnosis Date   Blindness    left eye since birth   Breast cancer (HCC) 06/2018   right breast ca   Breast cancer of upper-inner quadrant of right female breast (HCC) 06/12/2018   GERD (gastroesophageal reflux disease)    Hiatal hernia    History of cervical dysplasia    in her 19s, underwent partial cervix removal and cryosurgery   Hypertension    Personal history of radiation therapy    Stroke [facility]) 2000   denies deficits   Yeast infection     Past Surgical History:  Procedure Laterality Date   BREAST LUMPECTOMY Right 06/2018   BREAST LUMPECTOMY WITH RADIOACTIVE SEED AND SENTINEL LYMPH NODE BIOPSY Right 06/12/2018   Procedure: RIGHT BREAST LUMPECTOMY WITH RADIOACTIVE SEED AND  RIGHT AXILLARY  DEEP SENTINEL LYMPH NODE BIOPSY WITH BLUE DYE INJECTION ERAS PATHWAY;  Surgeon: Gail Favorite, MD;  Location: [facility];  Service: General;  Laterality: Right;  PECTORAL BLOCK   CAROTID ENDARTERECTOMY  2001   TONSILLECTOMY     WISDOM TOOTH EXTRACTION      Family History  Problem Relation Age of Onset   Diabetes Mother    Heart attack Father    Breast cancer Neg Hx    Bladder Cancer Neg Hx    Renal cancer Neg Hx    Uterine cancer Neg Hx     Social History   Socioeconomic History   Marital status: Single    Spouse name: Not on file  Number of children: Not on file   Years of education: Not on file   Highest education level: Not on file  Occupational History   Not on file  Tobacco Use   Smoking status: Former    Current packs/day: 0.00    Average packs/day: 0.5 packs/day for 20.0 years (10.0 ttl pk-yrs)    Types: Cigarettes    Start date: 06/30/1969    Quit date: 06/30/1989    Years since quitting: 34.8   Smokeless tobacco: Never  Vaping Use   Vaping status: Never Used  Substance and Sexual Activity   Alcohol use: No   Drug use: No   Sexual activity: Not Currently    Partners: Male   Other Topics Concern   Not on file  Social History Narrative   Lives alone in [place], daughter also lives in [place]. Walks with a cane. Retired was a curator the [employer]   Social Drivers of Health   Tobacco Use: Medium Risk (02/09/2024)   Patient History    Smoking Tobacco Use: Former    Smokeless Tobacco Use: Never    Passive Exposure: Not on Actuary Strain: Low Risk (05/13/2023)   Overall Financial Resource Strain (CARDIA)    Difficulty of Paying Living Expenses: Not very hard  Food Insecurity: Food Insecurity Present (05/13/2023)   Hunger Vital Sign    Worried About Running Out of Food in the Last Year: Sometimes true    Ran Out of Food in the Last Year: Never true  Transportation Needs: No Transportation Needs (05/13/2023)   PRAPARE - Administrator, Civil Service (Medical): No    Lack of Transportation (Non-Medical): No  Physical Activity: Insufficiently Active (05/13/2023)   Exercise Vital Sign    Days of Exercise per Week: 7 days    Minutes of Exercise per Session: 10 min  Stress: No Stress Concern Present (05/13/2023)   [employer] of Occupational Health - Occupational Stress Questionnaire    Feeling of Stress : Not at all  Social Connections: Unknown (05/13/2023)   Social Connection and Isolation Panel    Frequency of Communication with Friends and Family: Once a week    Frequency of Social Gatherings with Friends and Family: Not on file    Attends Religious Services: 1 to 4 times per year    Active Member of [employer] or Organizations: Yes    Attends Banker Meetings: 1 to 4 times per year    Marital Status: Widowed  Depression (PHQ2-9): Medium Risk (09/08/2023)   Depression (PHQ2-9)    PHQ-2 Score: 8  Alcohol Screen: Low Risk (05/13/2023)   Alcohol Screen    Last Alcohol Screening Score (AUDIT): 0  Housing: Unknown (05/13/2023)   Housing Stability Vital Sign    Unable to Pay for Housing in the Last Year: No     Number of Times Moved in the Last Year: Not on file    Homeless in the Last Year: No  Utilities: Not At Risk (05/13/2023)   AHC Utilities    Threatened with loss of utilities: No  Health Literacy: Adequate Health Literacy (05/13/2023)   B1300 Health Literacy    Frequency of need for help with medical instructions: Never    Allergies[1]  Outpatient Medications Prior to Visit  Medication Sig Dispense Refill   letrozole  (FEMARA ) 2.5 MG tablet Take 1 tablet (2.5 mg total) by mouth daily. 90 tablet 3   Misc. Devices MISC Electric rollator, foldable. Diagnosis - lower  extremity weakness 1 each 0   Misc. Devices MISC The wheelator light weight motorized hybrid-wheelchair and walker.  Include insurance risk surveyor and free accessory package.  Diagnosis lower extremity weakness 1 each 0   rosuvastatin  (CRESTOR ) 40 MG tablet Take 1 tablet (40 mg total) by mouth daily. 90 tablet 1   triamcinolone  cream (KENALOG ) 0.1 % APPLY TO THE AFFECTED AREA(S) TWICE DAILY 45 g 3   Vibegron  (GEMTESA ) 75 MG TABS Take 1 tablet (75 mg total) by mouth daily. 14 tablet    Vibegron  (GEMTESA ) 75 MG TABS Take 1 tablet (75 mg total) by mouth daily. 30 tablet 2   amLODipine  (NORVASC ) 5 MG tablet Take 1 tablet (5 mg total) by mouth daily. 90 tablet 3   No facility-administered medications prior to visit.     ROS Review of Systems  Constitutional:  Negative for activity change and appetite change.  HENT:  Negative for sinus pressure and sore throat.   Eyes:  Positive for discharge and visual disturbance.  Respiratory:  Negative for chest tightness, shortness of breath and wheezing.   Cardiovascular:  Negative for chest pain and palpitations.  Gastrointestinal:  Negative for abdominal distention, abdominal pain and constipation.  Genitourinary: Negative.   Musculoskeletal:        See HPI  Psychiatric/Behavioral:  Negative for behavioral problems and dysphoric mood.     Objective:  BP (!) 153/82   Pulse 86   Temp  98.3 F (36.8 C) (Oral)   Ht 4' 10 (1.473 m)   Wt 203 lb (92.1 kg)   SpO2 97%   BMI 42.43 kg/m      04/21/2024    9:07 AM 04/21/2024    8:36 AM 02/09/2024    8:16 AM  BP/Weight  Systolic BP 153 156 121  Diastolic BP 82 82 76  Wt. (Lbs)  203 203.4  BMI  42.43 kg/m2 42.51 kg/m2      Physical Exam Constitutional:      Appearance: She is well-developed.  Cardiovascular:     Rate and Rhythm: Normal rate.     Heart sounds: Normal heart sounds. No murmur heard. Pulmonary:     Effort: Pulmonary effort is normal.     Breath sounds: Normal breath sounds. No wheezing or rales.  Chest:     Chest wall: No tenderness.  Abdominal:     General: Bowel sounds are normal. There is no distension.     Palpations: Abdomen is soft. There is no mass.     Tenderness: There is no abdominal tenderness.  Musculoskeletal:        General: Normal range of motion.     Right lower leg: Edema present.     Left lower leg: Edema present.  Neurological:     Mental Status: She is alert and oriented to person, place, and time.  Psychiatric:        Mood and Affect: Mood normal.        Latest Ref Rng & Units 09/08/2023   10:34 AM 05/13/2023    5:15 PM 08/28/2022   11:14 AM  CMP  Glucose 70 - 99 mg/dL 96  79  91   BUN 8 - 27 mg/dL 17  17  10    Creatinine 0.57 - 1.00 mg/dL 8.85  8.92  8.94   Sodium 134 - 144 mmol/L 145  149  145   Potassium 3.5 - 5.2 mmol/L 4.4  4.4  3.6   Chloride 96 - 106 mmol/L 110  112  106   CO2 20 - 29 mmol/L 20  23  23    Calcium  8.7 - 10.3 mg/dL 89.8  89.8  9.9   Total Protein 6.0 - 8.5 g/dL 7.0  6.9  6.8   Total Bilirubin 0.0 - 1.2 mg/dL 0.5  0.4  0.5   Alkaline Phos 44 - 121 IU/L 178  183  142   AST 0 - 40 IU/L 30  42  34   ALT 0 - 32 IU/L 35  42  25     Lipid Panel     Component Value Date/Time   CHOL 256 (H) 09/08/2023 1034   TRIG 99 09/08/2023 1034   HDL 47 09/08/2023 1034   CHOLHDL 4.7 (H) 01/30/2022 1020   CHOLHDL 4.3 03/17/2012 1712   VLDL 13 03/17/2012  1712   LDLCALC 192 (H) 09/08/2023 1034    CBC    Component Value Date/Time   WBC 5.1 06/10/2018 1030   RBC 4.46 06/10/2018 1030   HGB 13.5 06/10/2018 1030   HCT 41.0 06/10/2018 1030   PLT 180 06/10/2018 1030   MCV 91.9 06/10/2018 1030   MCH 30.3 06/10/2018 1030   MCHC 32.9 06/10/2018 1030   RDW 12.8 06/10/2018 1030   LYMPHSABS 1.3 06/10/2018 1030   MONOABS 0.5 06/10/2018 1030   EOSABS 0.1 06/10/2018 1030   BASOSABS 0.0 06/10/2018 1030    Lab Results  Component Value Date   HGBA1C 5.9 04/21/2024   Lab Results  Component Value Date   HGBA1C 5.9 04/21/2024   HGBA1C 5.7 (A) 10/21/2023   HGBA1C 5.8 (H) 05/13/2023    Lab Results  Component Value Date   TSH 2.610 05/27/2022       Assessment & Plan Primary hypertension Blood pressure elevated despite amlodipine . Compliance confirmed. - Rechecked blood pressure before leaving clinic still elevated.  She attributes this to agitation over having to arrive the clinic early and waiting for her appointment when the clinic doors were closed. - Continue current dose of amlodipine , no regimen changes as blood pressure is well-controlled at her last 2 visits last month and the month before. - Schedule visit if blood pressure remains elevated at other specialist visits to consider increasing amlodipine  dose.  Prediabetes A1c increased from 5.7 to 5.9, indicating worsening prediabetes. - Advised caution with diet, especially during holidays.  Hyperlipidemia Cholesterol previously elevated; increased rosuvastatin  dose. Repeat lipid panel needed. - Ordered repeat lipid panel. - Low cholesterol diet  Urge urinary incontinence Managed with Gemtesa , beneficial but concerns about cost and cognitive side effects of alternatives. - Continue Gemtesa . - Discuss cost and alternatives with urologist if needed.  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 Follow-up  scheduled. - Continue letrozole . - Follow up with cancer center for mammogram in February and annual visit in April.  Cold intolerance Reports persistent cold sensation. Previous thyroid  tests normal, further testing warranted. - Ordered thyroid  function tests including TSH, free T4, and T3. - Ordered iron level test.   Obesity - Work on caloric restriction - Unfortunately she is unable to exercise due to leg weakness   Meds ordered this encounter  Medications   amLODipine  (NORVASC ) 5 MG tablet    Sig: Take 1 tablet (5 mg total) by mouth daily.    Dispense:  90 tablet    Refill:  1    Follow-up: Return in about 6 months (around 10/20/2024) for Chronic medical conditions.       Amylee Lodato  Karl Erway, MD, FAAFP. [facility] [place], [place] [phone]   04/21/2024, 12:18 PM     [1]  Allergies Allergen Reactions   Penicillins    Sulfa Antibiotics    "

## 2024-04-22 ENCOUNTER — Ambulatory Visit: Payer: Self-pay | Admitting: Family Medicine

## 2024-04-22 LAB — CBC WITH DIFFERENTIAL/PLATELET
Basophils Absolute: 0 x10E3/uL (ref 0.0–0.2)
Basos: 1 %
EOS (ABSOLUTE): 0.1 x10E3/uL (ref 0.0–0.4)
Eos: 2 %
Hematocrit: 45.7 % (ref 34.0–46.6)
Hemoglobin: 14.6 g/dL (ref 11.1–15.9)
Immature Grans (Abs): 0 x10E3/uL (ref 0.0–0.1)
Immature Granulocytes: 0 %
Lymphocytes Absolute: 1.5 x10E3/uL (ref 0.7–3.1)
Lymphs: 25 %
MCH: 28.7 pg (ref 26.6–33.0)
MCHC: 31.9 g/dL (ref 31.5–35.7)
MCV: 90 fL (ref 79–97)
Monocytes Absolute: 0.4 x10E3/uL (ref 0.1–0.9)
Monocytes: 8 %
Neutrophils Absolute: 3.8 x10E3/uL (ref 1.4–7.0)
Neutrophils: 64 %
Platelets: 186 x10E3/uL (ref 150–450)
RBC: 5.09 x10E6/uL (ref 3.77–5.28)
RDW: 13.4 % (ref 11.7–15.4)
WBC: 5.9 x10E3/uL (ref 3.4–10.8)

## 2024-04-22 LAB — BASIC METABOLIC PANEL WITH GFR
BUN/Creatinine Ratio: 18 (ref 12–28)
BUN: 18 mg/dL (ref 8–27)
CO2: 23 mmol/L (ref 20–29)
Calcium: 10.4 mg/dL — ABNORMAL HIGH (ref 8.7–10.3)
Chloride: 108 mmol/L — ABNORMAL HIGH (ref 96–106)
Creatinine, Ser: 0.99 mg/dL (ref 0.57–1.00)
Glucose: 94 mg/dL (ref 70–99)
Potassium: 4.3 mmol/L (ref 3.5–5.2)
Sodium: 145 mmol/L — ABNORMAL HIGH (ref 134–144)
eGFR: 60 mL/min/1.73 (ref >59)

## 2024-04-22 LAB — T4, FREE: Free T4: 1.06 ng/dL (ref 0.82–1.77)

## 2024-04-22 LAB — LP+NON-HDL CHOLESTEROL
Cholesterol, Total: 183 mg/dL (ref 100–199)
HDL: 55 mg/dL (ref >39)
LDL Chol Calc (NIH): 104 mg/dL — ABNORMAL HIGH (ref 0–99)
Total Non-HDL-Chol (LDL+VLDL): 128 mg/dL (ref 0–129)
Triglycerides: 134 mg/dL (ref 0–149)
VLDL Cholesterol Cal: 24 mg/dL (ref 5–40)

## 2024-04-22 LAB — TSH: TSH: 1.54 u[IU]/mL (ref 0.450–4.500)

## 2024-04-22 LAB — T3: T3, Total: 146 ng/dL (ref 71–180)

## 2024-04-27 ENCOUNTER — Other Ambulatory Visit: Payer: Self-pay

## 2024-04-27 ENCOUNTER — Ambulatory Visit: Attending: Family Medicine

## 2024-04-27 VITALS — BP 153/82 | HR 86 | Ht <= 58 in | Wt 203.0 lb

## 2024-04-27 DIAGNOSIS — R21 Rash and other nonspecific skin eruption: Secondary | ICD-10-CM

## 2024-04-27 DIAGNOSIS — Z0000 Encounter for general adult medical examination without abnormal findings: Secondary | ICD-10-CM | POA: Diagnosis not present

## 2024-04-27 MED ORDER — TRIAMCINOLONE ACETONIDE 0.1 % EX CREA: TOPICAL_CREAM | Freq: Two times a day (BID) | CUTANEOUS | 3 refills | Status: AC

## 2024-04-27 NOTE — Patient Instructions (Signed)
 Ms. Ausley,  Thank you for taking the time for your Medicare Wellness Visit. I appreciate your continued commitment to your health goals. Please review the care plan we discussed, and feel free to reach out if I can assist you further.  Please note that Annual Wellness Visits do not include a physical exam. Some assessments may be limited, especially if the visit was conducted virtually. If needed, we may recommend an in-person follow-up with your provider.  Ongoing Care Seeing your primary care provider every 3 to 6 months helps us  monitor your health and provide consistent, personalized care.   Referrals If a referral was made during today's visit and you haven't received any updates within two weeks, please contact the referred provider directly to check on the status.  Recommended Screenings:  Health Maintenance  Topic Date Due   Medicare Annual Wellness Visit  Never done   COVID-19 Vaccine (1) Never done   Breast Cancer Screening  06/15/2024   Zoster (Shingles) Vaccine (1 of 2) 07/20/2024*   Flu Shot  08/03/2024*   Pneumococcal Vaccine for age over 17 (1 of 1 - PCV) 09/07/2024*   Colon Cancer Screening  05/06/2025   Osteoporosis screening with Bone Density Scan  06/15/2025   DTaP/Tdap/Td vaccine (3 - Td or Tdap) 08/29/2032   Hepatitis C Screening  Completed   Meningitis B Vaccine  Aged Out  *Topic was postponed. The date shown is not the original due date.       01/30/2022    8:36 AM  Advanced Directives  Does Patient Have a Medical Advance Directive? No  Would patient like information on creating a medical advance directive? No - Patient declined    Vision: Annual vision screenings are recommended for early detection of glaucoma, cataracts, and diabetic retinopathy. These exams can also reveal signs of chronic conditions such as diabetes and high blood pressure.  Dental: Annual dental screenings help detect early signs of oral cancer, gum disease, and other conditions  linked to overall health, including heart disease and diabetes.  Please see the attached documents for additional preventive care recommendations.

## 2024-04-27 NOTE — Progress Notes (Signed)
 "  Chief Complaint  Patient presents with   Medicare Wellness    INITIAL     Subjective:   Caitlyn Mccoy is a 73 y.o. female who presents for a [employer] Visit.  Visit info / Clinical Intake: Medicare Wellness Visit Type:: Initial Annual Wellness Visit Persons participating in visit and providing information:: patient Medicare Wellness Visit Mode:: In-person (required for WTM) Interpreter Needed?: No Pre-visit prep was completed: yes AWV questionnaire completed by patient prior to visit?: no Living arrangements:: (!) lives alone Patient's Overall Health Status Rating: good Typical amount of pain: none Does pain affect daily life?: no Are you currently prescribed opioids?: no  Dietary Habits and Nutritional Risks How many meals a day?: 3 Eats fruit and vegetables daily?: yes Most meals are obtained by: preparing own meals In the last 2 weeks, have you had any of the following?: none Diabetic:: no  Functional Status Activities of Daily Living (to include ambulation/medication): Independent Ambulation: Independent with device- listed below Home Assistive Devices/Equipment: Walker (specify Type); Cane; Elevated toliet seat (ROLLATOR, RX FOR BATH LIFT) Medication Administration: Independent Home Management (perform basic housework or laundry): Needs assistance (comment) Manage your own finances?: yes Primary transportation is: facility / other Data Processing Manager) Concerns about vision?: no *vision screening is required for WTM* (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) Concerns about hearing?: no  Fall Screening Falls in the past year?: 1 Number of falls in past year: 0 Was there an injury with Fall?: 0 Fall Risk Category Calculator: 1 Patient Fall Risk Level: Low Fall Risk  Fall Risk Patient at Risk for Falls Due to: Impaired vision; Orthopedic patient; Impaired balance/gait Fall risk Follow up: Falls evaluation completed; Education provided  Home and  Transportation Safety: All rugs have non-skid backing?: N/A, no rugs All stairs or steps have railings?: N/A, no stairs Grab bars in the bathtub or shower?: (!) no (Patient has an elevated toliet seat) Have non-skid surface in bathtub or shower?: (!) no Good home lighting?: yes Regular seat belt use?: yes Hospital stays in the last year:: no  Cognitive Assessment Difficulty concentrating, remembering, or making decisions? : no Will 6CIT or Mini Cog be Completed: yes What year is it?: 0 points What month is it?: 0 points Give patient an address phrase to remember (5 components): [facility] [address] About what time is it?: 0 points Count backwards from 20 to 1: 0 points Say the months of the year in reverse: 0 points Repeat the address phrase from earlier: 0 points 6 CIT Score: 0 points  Advance Directives (For Healthcare) Does Patient Have a Medical Advance Directive?: No Would patient like information on creating a medical advance directive?: Yes (ED - Information included in AVS) (Paperwork given to patient)  Reviewed/Updated  Reviewed/Updated: Reviewed All (Medical, Surgical, Family, Medications, Allergies, Care Teams, Patient Goals)    Allergies (verified) Penicillins and Sulfa antibiotics   Current Medications (verified) Outpatient Encounter Medications as of 04/27/2024  Medication Sig   amLODipine  (NORVASC ) 5 MG tablet Take 1 tablet (5 mg total) by mouth daily.   letrozole  (FEMARA ) 2.5 MG tablet Take 1 tablet (2.5 mg total) by mouth daily.   Misc. Devices MISC Electric rollator, foldable. Diagnosis - lower extremity weakness   Misc. Devices MISC The wheelator light weight motorized hybrid-wheelchair and walker.  Include insurance risk surveyor and free accessory package.  Diagnosis lower extremity weakness   rosuvastatin  (CRESTOR ) 40 MG tablet Take 1 tablet (40 mg total) by mouth daily.  triamcinolone  cream (KENALOG ) 0.1 % APPLY TO THE AFFECTED AREA(S) TWICE DAILY    Vibegron  (GEMTESA ) 75 MG TABS Take 1 tablet (75 mg total) by mouth daily.   Vibegron  (GEMTESA ) 75 MG TABS Take 1 tablet (75 mg total) by mouth daily.   No facility-administered encounter medications on file as of 04/27/2024.    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   GERD (gastroesophageal reflux disease)    Hiatal hernia    History of cervical dysplasia    in her 67s, underwent partial cervix removal and cryosurgery   Hypertension    Personal history of radiation therapy    Stroke (HCC) 2000   denies deficits   Yeast infection    Past Surgical History:  Procedure Laterality Date   BREAST LUMPECTOMY Right 06/2018   BREAST LUMPECTOMY WITH RADIOACTIVE SEED AND SENTINEL LYMPH NODE BIOPSY Right 06/12/2018   Procedure: RIGHT BREAST LUMPECTOMY WITH RADIOACTIVE SEED AND  RIGHT AXILLARY  DEEP SENTINEL LYMPH NODE BIOPSY WITH BLUE DYE INJECTION ERAS PATHWAY;  Surgeon: Gail Favorite, MD;  Location: [facility];  Service: General;  Laterality: Right;  PECTORAL BLOCK   CAROTID ENDARTERECTOMY  2001   TONSILLECTOMY     WISDOM TOOTH EXTRACTION     Family History  Problem Relation Age of Onset   Diabetes Mother    Heart attack Father    Breast cancer Neg Hx    Bladder Cancer Neg Hx    Renal cancer Neg Hx    Uterine cancer Neg Hx    Social History   Occupational History   Not on file  Tobacco Use   Smoking status: Former    Current packs/day: 0.00    Average packs/day: 0.5 packs/day for 20.0 years (10.0 ttl pk-yrs)    Types: Cigarettes    Start date: 06/30/1969    Quit date: 06/30/1989    Years since quitting: 34.8   Smokeless tobacco: Never  Vaping Use   Vaping status: Never Used  Substance and Sexual Activity   Alcohol use: No   Drug use: No   Sexual activity: Not Currently    Partners:  Male   Tobacco Counseling Counseling given: Not Answered  SDOH Screenings   Food Insecurity: Patient Declined (04/27/2024)  Housing: Low Risk (04/27/2024)  Transportation Needs: No Transportation Needs (04/27/2024)  Utilities: Not At Risk (04/27/2024)  Alcohol Screen: Low Risk (04/27/2024)  Depression (PHQ2-9): Low Risk (04/27/2024)  Financial Resource Strain: Low Risk (05/13/2023)  Physical Activity: Insufficiently Active (04/27/2024)  Social Connections: Unknown (04/27/2024)  Stress: Stress Concern Present (04/27/2024)  Tobacco Use: Medium Risk (04/27/2024)  Health Literacy: Adequate Health Literacy (04/27/2024)   See flowsheets for full screening details  Depression Screen PHQ 2 & 9 Depression Scale- Over the past 2 weeks, how often have you been bothered by any of the following problems? Little interest or pleasure in doing things: 0 Feeling down, depressed, or hopeless (PHQ Adolescent also includes...irritable): 1 PHQ-2 Total Score: 1 Trouble falling or staying asleep, or sleeping too much: 0 Feeling tired or having little energy: 0 Poor appetite or overeating (PHQ Adolescent also includes...weight loss): 0 Feeling bad about yourself - or that you are a failure or have let yourself or your family down: 0 Trouble concentrating on things, such as reading the newspaper or  watching television (PHQ Adolescent also includes...like school work): 0 Moving or speaking so slowly that other people could have noticed. Or the opposite - being so fidgety or restless that you have been moving around a lot more than usual: 0 Thoughts that you would be better off dead, or of hurting yourself in some way: 0 PHQ-9 Total Score: 1 If you checked off any problems, how difficult have these problems made it for you to do your work, take care of things at home, or get along with other people?: Not difficult at all  Depression Treatment Depression Interventions/Treatment : EYV7-0 Score <4 Follow-up  Not Indicated     Goals Addressed             This Visit's Progress    04/27/2024: To get off my medications and to join [employer].               Objective:    Today's Vitals   04/27/24 0836  BP: (!) 153/82  Pulse: 86  SpO2: 97%  Weight: 203 lb (92.1 kg)  Height: 4' 10 (1.473 m)  PainSc: 7   PainLoc: Generalized   Body mass index is 42.43 kg/m.  Hearing/Vision screen Hearing Screening - Comments:: No issues with hearing. Vision Screening - Comments:: Left eye vision loss at birth. Immunizations and Health Maintenance Health Maintenance  Topic Date Due   COVID-19 Vaccine (1) Never done   Mammogram  06/15/2024   Zoster Vaccines- Shingrix (1 of 2) 07/20/2024 (Originally 06/07/1969)   Influenza Vaccine  08/03/2024 (Originally 12/05/2023)   Pneumococcal Vaccine: 50+ Years (1 of 1 - PCV) 09/07/2024 (Originally 06/07/2000)   Medicare Annual Wellness (AWV)  04/27/2025   Colonoscopy  05/06/2025   Bone Density Scan  06/15/2025   DTaP/Tdap/Td (3 - Td or Tdap) 08/29/2032   Hepatitis C Screening  Completed   Meningococcal B Vaccine  Aged Out        Assessment/Plan:  This is a routine wellness examination for [place].  Patient Care Team: Delbert Clam, MD as PCP - General (Family Medicine) Odean Potts, MD as Consulting Physician (Hematology and Oncology) Izell Domino, MD as Attending Physician (Radiation Oncology) Gail Favorite, MD as Consulting Physician (General Surgery) Armond Cape, MD as Consulting Physician (Obstetrics and Gynecology) Odean Potts, MD as Consulting Physician (Hematology and Oncology)  I have personally reviewed and noted the following in the patients chart:   Medical and social history Use of alcohol, tobacco or illicit drugs  Current medications and supplements including opioid prescriptions. Functional ability and status Nutritional status Physical activity Advanced directives List of other physicians Hospitalizations,  surgeries, and ER visits in previous 12 months Vitals Screenings to include cognitive, depression, and falls Referrals and appointments  No orders of the defined types were placed in this encounter.  In addition, I have reviewed and discussed with patient certain preventive protocols, quality metrics, and best practice recommendations. A written personalized care plan for preventive services as well as general preventive health recommendations were provided to patient.   Roz LOISE Fuller, LPN   [id number]   Return in about 1 year (around 04/27/2025) for Medicare wellness.  After Visit Summary: (In Person-Printed) AVS printed and given to the patient  Nurse Notes: None at this time. "

## 2024-04-28 NOTE — Telephone Encounter (Signed)
 Patient stated during AWV that she would like a referral for home health.  She stated that she needs assistance with her ADL's. Please advise.  Penina Reisner N. Tomie, LPN [facility] Annual Wellness Team Direct Dial: [phone]

## 2024-05-03 ENCOUNTER — Telehealth: Payer: Self-pay

## 2024-05-03 DIAGNOSIS — Z170 Estrogen receptor positive status [ER+]: Secondary | ICD-10-CM

## 2024-05-03 NOTE — Telephone Encounter (Signed)
 Call placed to patient and VM was left informing patient to return phone call.   Patient wanted to inquire about PCS services, patient does not currently have [employer] and will not qualify for PCS services.   She may qualify for home health and that order can be placed if she is interested.

## 2024-05-10 ENCOUNTER — Ambulatory Visit: Admitting: Obstetrics

## 2024-05-10 ENCOUNTER — Encounter: Payer: Self-pay | Admitting: Obstetrics

## 2024-05-10 ENCOUNTER — Other Ambulatory Visit (HOSPITAL_COMMUNITY)
Admission: RE | Admit: 2024-05-10 | Discharge: 2024-05-10 | Disposition: A | Discharge status: Home / Self Care | Source: Ambulatory Visit | Attending: Obstetrics | Admitting: Obstetrics

## 2024-05-10 VITALS — BP 139/83 | HR 102

## 2024-05-10 DIAGNOSIS — R3914 Feeling of incomplete bladder emptying: Secondary | ICD-10-CM

## 2024-05-10 DIAGNOSIS — K5900 Constipation, unspecified: Secondary | ICD-10-CM | POA: Diagnosis not present

## 2024-05-10 DIAGNOSIS — N952 Postmenopausal atrophic vaginitis: Secondary | ICD-10-CM

## 2024-05-10 DIAGNOSIS — R319 Hematuria, unspecified: Secondary | ICD-10-CM | POA: Diagnosis present

## 2024-05-10 DIAGNOSIS — R8290 Unspecified abnormal findings in urine: Secondary | ICD-10-CM | POA: Insufficient documentation

## 2024-05-10 DIAGNOSIS — N3941 Urge incontinence: Secondary | ICD-10-CM

## 2024-05-10 LAB — URINALYSIS, COMPLETE (UACMP) WITH MICROSCOPIC
Bilirubin Urine: NEGATIVE
Glucose, UA: NEGATIVE mg/dL
Hgb urine dipstick: NEGATIVE
Ketones, ur: NEGATIVE mg/dL
Nitrite: NEGATIVE
Protein, ur: NEGATIVE mg/dL
Specific Gravity, Urine: 1.024 (ref 1.005–1.030)
pH: 5 (ref 5.0–8.0)

## 2024-05-10 LAB — POCT URINALYSIS DIP (CLINITEK)
Bilirubin, UA: NEGATIVE
Bilirubin, UA: NEGATIVE
Glucose, UA: NEGATIVE mg/dL
Glucose, UA: NEGATIVE mg/dL
Ketones, POC UA: NEGATIVE mg/dL
Ketones, POC UA: NEGATIVE mg/dL
Leukocytes, UA: NEGATIVE
Nitrite, UA: NEGATIVE
Nitrite, UA: NEGATIVE
POC PROTEIN,UA: 30 — AB
POC PROTEIN,UA: NEGATIVE
Spec Grav, UA: 1.025
Spec Grav, UA: >=1.03 — AB
Urobilinogen, UA: 0.2 U/dL
Urobilinogen, UA: 0.2 U/dL
pH, UA: 5.5
pH, UA: 5.5

## 2024-05-10 MED ORDER — GEMTESA 75 MG PO TABS: 75.0000 mg | ORAL_TABLET | Freq: Every day | ORAL | 3 refills | Status: AC

## 2024-05-10 NOTE — Assessment & Plan Note (Signed)
-   discussed proper vulvar care, warm compression, cotton only underwear  - reviewed barrier ointment with Vaseline if needed  - encouraged Vit E suppository, moisturizer with Replens/Revaree - will avoid vaginal estrogen as 1st line treatment due to history of breast cancer on letrozole  - encouraged to stop pad use if she experiences resolution of urinary leakage

## 2024-05-10 NOTE — Progress Notes (Addendum)
 Caitlyn Mccoy Return Visit  SUBJECTIVE  History of Present Illness: Caitlyn Mccoy is a 74 y.o. female seen in follow-up for urgency urinary incontinence vaginal atrophy, constipation, and abnormal urinalysis. Plan at last visit was trial of Gemtesa , fiber supplementation, Vit E suppository.   Reports doing much better with Gemtesa  reduced urgency and increased void volume.  UUI 4x/week at baseline, now 2x/week on Gemtesa  when she delays urge with decrease leak volume Pad use 1/day for safety, previously changes 2 pad over adult diapers/day. Drinks: 64oz water per day, 8oz juice, intermittent 16oz tea 2-3x/week, 12oz ginger ale soda 5x/week  Increased fruit intake with smooth bowel movements every 2 days and decreased straining Reduced sensation of gas in her vagina  Has motorized wheelchair Right breast cancer s/p lumpectomy and radiation on letrozole  with calcification in upper inner right breast on 12/29/23 mammogram, last seen in 08/2024   Past Medical History: Patient  has a past medical history of Blindness, Breast cancer (HCC) (06/2018), Breast cancer of upper-inner quadrant of right female breast (HCC) (06/12/2018), GERD (gastroesophageal reflux disease), Hiatal hernia, History of cervical dysplasia, Hypertension, Personal history of radiation therapy, Stroke (HCC) (2000), and Yeast infection.   Past Surgical History: She  has a past surgical history that includes Carotid endarterectomy (2001); Wisdom tooth extraction; Tonsillectomy; Breast lumpectomy with radioactive seed and sentinel lymph node biopsy (Right, 06/12/2018); and Breast lumpectomy (Right, 06/2018).   Medications: She has a current medication list which includes the following prescription(s): amlodipine , letrozole , misc. devices, misc. devices, rosuvastatin , triamcinolone  cream, and gemtesa .   Allergies: Patient is allergic to penicillins and sulfa antibiotics.   Social History: Patient  reports that she  quit smoking about 34 years ago. Her smoking use included cigarettes. She started smoking about 54 years ago. She has a 10 pack-year smoking history. She has never used smokeless tobacco. She reports that she does not drink alcohol and does not use drugs.     OBJECTIVE     Physical Exam: Vitals:   05/10/24 0908  BP: 139/83  Pulse: (!) 102   Gen: No apparent distress, A&O x 3.  Detailed Urogynecologic Evaluation:  Deferred. Vaginal atrophy on exam, normal urethral meatus without bleeding or abnormal discharge  Straight Catheterization Procedure for PVR: After verbal consent was obtained from the patient for catheterization to assess bladder emptying and residual volume the urethra and surrounding tissues were prepped with betadine and an in and out catheterization was performed.  PVR was 20mL.  Urine appeared clear yellow. The patient tolerated the procedure well.  Lab Results  Component Value Date   COLORU yellow 05/10/2024   CLARITYU clear 05/10/2024   GLUCOSEUR negative 05/10/2024   BILIRUBINUR negative 05/10/2024   SPECGRAV 1.025 05/10/2024   RBCUR small (A) 05/10/2024   PHUR 5.5 05/10/2024   PROTEINUR NEGATIVE 02/09/2024   UROBILINOGEN 0.2 05/10/2024   LEUKOCYTESUR Negative 05/10/2024   Lab Results  Component Value Date   CREATININE 0.99 04/21/2024       ASSESSMENT AND PLAN    [name] is a 74 y.o. with:  1. Constipation, unspecified constipation type   2. Urge urinary incontinence   3. Abnormal urinalysis   4. Feeling of incomplete bladder emptying   5. Vaginal atrophy     Constipation, unspecified constipation type Assessment & Plan: - fecal smearing noted on exam, denies incontinence symptoms - For constipation, we reviewed the importance of a better bowel regimen.  We also discussed the importance of avoiding chronic straining, as  it can exacerbate her pelvic floor symptoms; we discussed treating constipation and straining prior to surgery, as  postoperative straining can lead to damage to the repair and recurrence of symptoms. We discussed initiating therapy with increasing fluid intake, fiber supplementation, stool softeners, and laxatives such as miralax.  - encouraged titration of fiber supplementation and squatting position  - discussed association with pelvic floor disorders - improved with dietary fiber and increased fruit intake with reduced straining, continue to monitor   Urge urinary incontinence Assessment & Plan: - 02/09/24 POCT UA + leuk/heme, catheterized UA microscopy 0-5 RBC/hpf and culture negative. Pending catheterized UA micro and culture - PVR 20mL on Gemtesa  with reduction of UUI and improved sensation of emptying - We discussed the symptoms of overactive bladder (OAB), which include urinary urgency, urinary frequency, nocturia, with or without urge incontinence.  While we do not know the exact etiology of OAB, several treatment options exist. We discussed management including behavioral therapy (decreasing bladder irritants, urge suppression strategies, timed voids, bladder retraining), physical therapy, medication; for refractory cases posterior tibial nerve stimulation, sacral neuromodulation, and intravesical botulinum toxin injection.  For anticholinergic medications, we discussed the potential side effects of anticholinergics including dry eyes, dry mouth, constipation, cognitive impairment and urinary retention. For Beta-3 agonist medication, we discussed the potential side effect of elevated blood pressure which is more likely to occur in individuals with uncontrolled hypertension. - discussed fluid management and weight reduction - possible functional incontinence due to limitations in mobility, now using motorized wheelchair. Consider bedside commode if night time symptoms - continue Gemtesa  ($100 for 90 days), Rx for 90 day supply - desires to avoid surgical or procedural interventions, declines additional  treatments at this time due to relief - consider urodynamics due to history of CVA with risk of neurogenic bladder  Orders: -     POCT URINALYSIS DIP (CLINITEK) -     Gemtesa ; Take 1 tablet (75 mg total) by mouth daily.  Dispense: 90 tablet; Refill: 3  Abnormal urinalysis Assessment & Plan: - 02/09/24 POCT UA + leuk/heme, catheterized UA microscopy 0-5 RBC/hpf and culture negative - repeat catheterized UA today - denies gross hematuria - history of 10 pack years - For management of microscopic hematuria, we discussed the importance of work-up including assessing the upper and lower GU tract with renal ultrasound and cystoscopy if repeat testing is positive. She will pursue this work-up and follow-up afterward to discuss the results and decide on a treatment plan based on the findings.  - reviewed procedure of cystoscopy - renal ultrasound ordered, office to schedule cystoscopy - Cr 1.14 in 09/08/23, 0.99 in 04/21/24 - encouraged to avoid antibiotic treatment in the absence of UTI symptoms   Orders: -     POCT URINALYSIS DIP (CLINITEK) -     US  RENAL; Future -     Urine Culture; Future -     Urinalysis, Complete w Microscopic; Future  Feeling of incomplete bladder emptying Assessment & Plan: - PVR 43mL, catheterized for and repeat 20mL today - improved since Gemtesa  - consider urodynamics if clinical change due to h/o CVA   Vaginal atrophy Assessment & Plan: - discussed proper vulvar care, warm compression, cotton only underwear  - reviewed barrier ointment with Vaseline if needed  - encouraged Vit E suppository, moisturizer with Replens/Revaree - will avoid vaginal estrogen as 1st line treatment due to history of breast cancer on letrozole  - encouraged to stop pad use if she experiences resolution of urinary leakage  Time spent: I spent 37 minutes dedicated to the care of this patient on the date of this encounter to include pre-visit review of records, face-to-face time  with the patient discussing urgency urinary incontinence, microscopic hematuria, abnormal urinalysis, sensation of incomplete emptying, vaginal atrophy, and post visit documentation and ordering medication/ testing.   Lianne ONEIDA Gillis, MD

## 2024-05-10 NOTE — Assessment & Plan Note (Addendum)
-   02/09/24 POCT UA + leuk/heme, catheterized UA microscopy 0-5 RBC/hpf and culture negative. Pending catheterized UA micro and culture - PVR 20mL on Gemtesa  with reduction of UUI and improved sensation of emptying - We discussed the symptoms of overactive bladder (OAB), which include urinary urgency, urinary frequency, nocturia, with or without urge incontinence.  While we do not know the exact etiology of OAB, several treatment options exist. We discussed management including behavioral therapy (decreasing bladder irritants, urge suppression strategies, timed voids, bladder retraining), physical therapy, medication; for refractory cases posterior tibial nerve stimulation, sacral neuromodulation, and intravesical botulinum toxin injection.  For anticholinergic medications, we discussed the potential side effects of anticholinergics including dry eyes, dry mouth, constipation, cognitive impairment and urinary retention. For Beta-3 agonist medication, we discussed the potential side effect of elevated blood pressure which is more likely to occur in individuals with uncontrolled hypertension. - discussed fluid management and weight reduction - possible functional incontinence due to limitations in mobility, now using motorized wheelchair. Consider bedside commode if night time symptoms - continue Gemtesa  ($100 for 90 days), Rx for 90 day supply - desires to avoid surgical or procedural interventions, declines additional treatments at this time due to relief - consider urodynamics due to history of CVA with risk of neurogenic bladder

## 2024-05-10 NOTE — Patient Instructions (Addendum)
 Continue Gemtesa  1 tab daily  - continue proper vulvar care, warm compression, avoid pad use, cotton only underwear and barrier ointment if needed  - encouraged Vit E suppository, moisturizer with Replens/Revaree  For management of microscopic hematuria, we discussed the importance of work-up including assessing the upper and lower GU tract with renal ultrasound and cystoscopy.   Please call radiology at [phone] to schedule your renal ultrasound imaging study today

## 2024-05-10 NOTE — Assessment & Plan Note (Addendum)
-   02/09/24 POCT UA + leuk/heme, catheterized UA microscopy 0-5 RBC/hpf and culture negative - repeat catheterized UA today - denies gross hematuria - history of 10 pack years - For management of microscopic hematuria, we discussed the importance of work-up including assessing the upper and lower GU tract with renal ultrasound and cystoscopy if repeat testing is positive. She will pursue this work-up and follow-up afterward to discuss the results and decide on a treatment plan based on the findings.  - reviewed procedure of cystoscopy - renal ultrasound ordered, office to schedule cystoscopy - Cr 1.14 in 09/08/23, 0.99 in 04/21/24 - encouraged to avoid antibiotic treatment in the absence of UTI symptoms

## 2024-05-10 NOTE — Assessment & Plan Note (Addendum)
-   PVR 43mL, catheterized for and repeat 20mL today - improved since Gemtesa  - consider urodynamics if clinical change due to h/o CVA

## 2024-05-10 NOTE — Addendum Note (Signed)
 Addended by: Lannie Heaps, HYDE T on: 05/10/2024 10:06 AM   Modules accepted: Orders

## 2024-05-10 NOTE — Assessment & Plan Note (Signed)
-   fecal smearing noted on exam, denies incontinence symptoms - For constipation, we reviewed the importance of a better bowel regimen.  We also discussed the importance of avoiding chronic straining, as it can exacerbate her pelvic floor symptoms; we discussed treating constipation and straining prior to surgery, as postoperative straining can lead to damage to the repair and recurrence of symptoms. We discussed initiating therapy with increasing fluid intake, fiber supplementation, stool softeners, and laxatives such as miralax.  - encouraged titration of fiber supplementation and squatting position  - discussed association with pelvic floor disorders - improved with dietary fiber and increased fruit intake with reduced straining, continue to monitor

## 2024-05-11 ENCOUNTER — Ambulatory Visit: Payer: Self-pay | Admitting: Obstetrics

## 2024-05-11 DIAGNOSIS — R8290 Unspecified abnormal findings in urine: Secondary | ICD-10-CM

## 2024-05-13 MED ORDER — NITROFURANTOIN MONOHYD MACRO 100 MG PO CAPS: 100.0000 mg | ORAL_CAPSULE | Freq: Two times a day (BID) | ORAL | 0 refills | Status: AC

## 2024-05-14 LAB — URINE CULTURE: Culture: 20000 — AB

## 2024-05-17 NOTE — Telephone Encounter (Signed)
 Okay to place order for PCS.

## 2024-05-17 NOTE — Telephone Encounter (Unsigned)
 Copied from CRM #[id number]. Topic: General - Call Back - No Documentation >> May 17, 2024 12:45 PM Caitlyn Mccoy wrote: Reason for CRM: Pt states she is returning call to someone at clinic. I did not see anything documented of someone calling her? Advised it was lunch break so she requested call back [phone].

## 2024-05-17 NOTE — Telephone Encounter (Signed)
 Patient only has medicare so she does not qualify for PCS, she is requesting home health instead.

## 2024-05-18 NOTE — Telephone Encounter (Signed)
Home health referral has been placed

## 2024-05-18 NOTE — Addendum Note (Signed)
 Addended by: Alesia Oshields on: 05/18/2024 11:54 AM   Modules accepted: Orders

## 2024-05-18 NOTE — Telephone Encounter (Signed)
 A referral is needed for home health, I can not complete the PCS form for her due to her not having medicaid.

## 2024-05-21 ENCOUNTER — Telehealth: Payer: Self-pay

## 2024-05-21 NOTE — Telephone Encounter (Signed)
 Referral received for home health SN and PT as well as a home health aid.  I called the patient to inquire if she has a preference for home health agencies and I had to leave a message requesting a call back.

## 2024-05-24 NOTE — Telephone Encounter (Signed)
 I spoke to the patient and informed her of the home health referral.  She said that is not really what she needs.  She stated that she has a lot going on and would like to cancel the PT referral.   I explained to her that without Medicaid, she is not eligible for PCS.  She is eligible for a home health aide if she is receiving home nursing or PT and said she understood but still would like to cancel the PT referral.   I also explained to her about CAP services and the income requirement is slightly more than standard Medicaid allows.  She said she is not requiring the level of care needed for CAP.  She said she wants to remain independent and she will discuss the options with her daughter. She said her daughter does not live with her; but can be in the home when she bathes and assist with some ADLs as needed.   She was appreciative of the call but said she will work with her daughter.  She also spoke about how pleased she is with the motorized rollator that she purchased. She said that [facility] would not pay for it; but she is very glad she has it.

## 2024-06-01 ENCOUNTER — Ambulatory Visit (HOSPITAL_COMMUNITY)

## 2024-06-16 ENCOUNTER — Ambulatory Visit (HOSPITAL_COMMUNITY)

## 2024-07-05 ENCOUNTER — Encounter

## 2024-07-15 ENCOUNTER — Other Ambulatory Visit: Admitting: Obstetrics

## 2024-08-09 ENCOUNTER — Ambulatory Visit: Admitting: Obstetrics

## 2024-08-26 ENCOUNTER — Ambulatory Visit: Admitting: Hematology and Oncology

## 2024-10-20 ENCOUNTER — Ambulatory Visit: Payer: Self-pay | Admitting: Family Medicine
